# Patient Record
Sex: Male | Born: 1935 | Race: White | Hispanic: No | Marital: Married | Smoking: Never smoker
Health system: Southern US, Community
[De-identification: ages and names within clinical notes are randomized; demographics above are authoritative.]

## PROBLEM LIST (undated history)

## (undated) DIAGNOSIS — C801 Malignant (primary) neoplasm, unspecified: Secondary | ICD-10-CM

## (undated) DIAGNOSIS — F32A Depression, unspecified: Secondary | ICD-10-CM

## (undated) DIAGNOSIS — F329 Major depressive disorder, single episode, unspecified: Secondary | ICD-10-CM

## (undated) DIAGNOSIS — R269 Unspecified abnormalities of gait and mobility: Secondary | ICD-10-CM

## (undated) DIAGNOSIS — A809 Acute poliomyelitis, unspecified: Secondary | ICD-10-CM

## (undated) DIAGNOSIS — C61 Malignant neoplasm of prostate: Secondary | ICD-10-CM

## (undated) DIAGNOSIS — I639 Cerebral infarction, unspecified: Secondary | ICD-10-CM

## (undated) DIAGNOSIS — I10 Essential (primary) hypertension: Secondary | ICD-10-CM

## (undated) HISTORY — PX: KNEE SURGERY: SHX244

## (undated) HISTORY — DX: Cerebral infarction, unspecified: I63.9

---

## 2007-12-29 ENCOUNTER — Inpatient Hospital Stay (HOSPITAL_COMMUNITY): Admission: EM | Admit: 2007-12-29 | Discharge: 2007-12-30 | Payer: Self-pay | Admitting: Emergency Medicine

## 2007-12-29 ENCOUNTER — Ambulatory Visit: Payer: Self-pay | Admitting: Family Medicine

## 2009-05-15 ENCOUNTER — Encounter: Admission: RE | Admit: 2009-05-15 | Discharge: 2009-05-15 | Payer: Self-pay | Admitting: Geriatric Medicine

## 2009-07-05 ENCOUNTER — Emergency Department (HOSPITAL_COMMUNITY): Admission: EM | Admit: 2009-07-05 | Discharge: 2009-07-05 | Payer: Self-pay | Admitting: Emergency Medicine
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
## 2010-09-16 ENCOUNTER — Emergency Department (HOSPITAL_COMMUNITY): Payer: Medicare Other

## 2010-09-16 ENCOUNTER — Observation Stay (HOSPITAL_COMMUNITY)
Admission: EM | Admit: 2010-09-16 | Discharge: 2010-09-19 | Disposition: A | Discharge status: Home / Self Care | Payer: Medicare Other | Attending: Internal Medicine | Admitting: Internal Medicine

## 2010-09-16 ENCOUNTER — Encounter (HOSPITAL_COMMUNITY): Payer: Self-pay

## 2010-09-16 DIAGNOSIS — M4802 Spinal stenosis, cervical region: Secondary | ICD-10-CM | POA: Insufficient documentation

## 2010-09-16 DIAGNOSIS — I080 Rheumatic disorders of both mitral and aortic valves: Secondary | ICD-10-CM | POA: Insufficient documentation

## 2010-09-16 DIAGNOSIS — Z96659 Presence of unspecified artificial knee joint: Secondary | ICD-10-CM | POA: Insufficient documentation

## 2010-09-16 DIAGNOSIS — R9431 Abnormal electrocardiogram [ECG] [EKG]: Secondary | ICD-10-CM | POA: Insufficient documentation

## 2010-09-16 DIAGNOSIS — Z79899 Other long term (current) drug therapy: Secondary | ICD-10-CM | POA: Insufficient documentation

## 2010-09-16 DIAGNOSIS — G319 Degenerative disease of nervous system, unspecified: Secondary | ICD-10-CM | POA: Insufficient documentation

## 2010-09-16 DIAGNOSIS — I10 Essential (primary) hypertension: Secondary | ICD-10-CM | POA: Insufficient documentation

## 2010-09-16 DIAGNOSIS — Z8546 Personal history of malignant neoplasm of prostate: Secondary | ICD-10-CM | POA: Insufficient documentation

## 2010-09-16 DIAGNOSIS — F29 Unspecified psychosis not due to a substance or known physiological condition: Secondary | ICD-10-CM | POA: Insufficient documentation

## 2010-09-16 DIAGNOSIS — R29898 Other symptoms and signs involving the musculoskeletal system: Secondary | ICD-10-CM | POA: Insufficient documentation

## 2010-09-16 DIAGNOSIS — I7389 Other specified peripheral vascular diseases: Secondary | ICD-10-CM | POA: Insufficient documentation

## 2010-09-16 DIAGNOSIS — R0602 Shortness of breath: Secondary | ICD-10-CM | POA: Insufficient documentation

## 2010-09-16 DIAGNOSIS — G459 Transient cerebral ischemic attack, unspecified: Principal | ICD-10-CM | POA: Insufficient documentation

## 2010-09-16 DIAGNOSIS — I771 Stricture of artery: Secondary | ICD-10-CM | POA: Insufficient documentation

## 2010-09-16 DIAGNOSIS — R279 Unspecified lack of coordination: Secondary | ICD-10-CM | POA: Insufficient documentation

## 2010-09-16 DIAGNOSIS — Z8612 Personal history of poliomyelitis: Secondary | ICD-10-CM | POA: Insufficient documentation

## 2010-09-16 DIAGNOSIS — R269 Unspecified abnormalities of gait and mobility: Secondary | ICD-10-CM | POA: Insufficient documentation

## 2010-09-16 HISTORY — DX: Major depressive disorder, single episode, unspecified: F32.9

## 2010-09-16 HISTORY — DX: Depression, unspecified: F32.A

## 2010-09-16 HISTORY — DX: Acute poliomyelitis, unspecified: A80.9

## 2010-09-16 HISTORY — DX: Essential (primary) hypertension: I10

## 2010-09-16 LAB — CK TOTAL AND CKMB (NOT AT ARMC)
Relative Index: INVALID (ref 0.0–2.5)
Total CK: 93 U/L (ref 7–232)

## 2010-09-16 LAB — BASIC METABOLIC PANEL
Calcium: 9.3 mg/dL (ref 8.4–10.5)
Chloride: 105 mEq/L (ref 96–112)
Creatinine, Ser: 1.18 mg/dL (ref 0.50–1.35)
GFR calc Af Amer: >60 mL/min (ref >60)
Sodium: 137 mEq/L (ref 135–145)

## 2010-09-16 LAB — CBC
Hemoglobin: 15.7 g/dL (ref 13.0–17.0)
MCHC: 34 g/dL (ref 30.0–36.0)
MCV: 94.9 fL (ref 78.0–100.0)
WBC: 7.8 10*3/uL (ref 4.0–10.5)

## 2010-09-16 LAB — DIFFERENTIAL
Basophils Absolute: 0 10*3/uL (ref 0.0–0.1)
Eosinophils Relative: 4 % (ref 0–5)
Lymphocytes Relative: 20 % (ref 12–46)
Monocytes Absolute: 0.8 10*3/uL (ref 0.1–1.0)
Monocytes Relative: 10 % (ref 3–12)

## 2010-09-16 LAB — URINALYSIS, ROUTINE W REFLEX MICROSCOPIC
Glucose, UA: NEGATIVE mg/dL
Ketones, ur: NEGATIVE mg/dL
Leukocytes, UA: NEGATIVE
Protein, ur: NEGATIVE mg/dL
Specific Gravity, Urine: 1.024 (ref 1.005–1.030)

## 2010-09-17 ENCOUNTER — Inpatient Hospital Stay (HOSPITAL_COMMUNITY): Payer: Medicare Other

## 2010-09-17 DIAGNOSIS — G459 Transient cerebral ischemic attack, unspecified: Secondary | ICD-10-CM

## 2010-09-17 LAB — DIFFERENTIAL
Lymphocytes Relative: 24 % (ref 12–46)
Monocytes Absolute: 0.8 10*3/uL (ref 0.1–1.0)
Monocytes Relative: 10 % (ref 3–12)
Neutro Abs: 4.5 10*3/uL (ref 1.7–7.7)

## 2010-09-17 LAB — CBC
HCT: 44.7 % (ref 39.0–52.0)
WBC: 7.6 10*3/uL (ref 4.0–10.5)

## 2010-09-17 LAB — URINALYSIS, ROUTINE W REFLEX MICROSCOPIC
Bilirubin Urine: NEGATIVE
Ketones, ur: NEGATIVE mg/dL
Leukocytes, UA: NEGATIVE
Nitrite: NEGATIVE
Protein, ur: NEGATIVE mg/dL
Specific Gravity, Urine: 1.015 (ref 1.005–1.030)

## 2010-09-17 LAB — COMPREHENSIVE METABOLIC PANEL
ALT: 8 U/L (ref 0–53)
ALT: 7 U/L (ref 0–53)
AST: 22 U/L (ref 0–37)
AST: 22 U/L (ref 0–37)
Albumin: 3.5 g/dL (ref 3.5–5.2)
Albumin: 3.2 g/dL — ABNORMAL LOW (ref 3.5–5.2)
Alkaline Phosphatase: 51 U/L (ref 39–117)
CO2: 25 mEq/L (ref 19–32)
Calcium: 8.5 mg/dL (ref 8.4–10.5)
Chloride: 105 mEq/L (ref 96–112)
Creatinine, Ser: 0.99 mg/dL (ref 0.50–1.35)
GFR calc non Af Amer: >60 mL/min (ref >60)
Glucose, Bld: 89 mg/dL (ref 70–99)
Potassium: 3.8 mEq/L (ref 3.5–5.1)
Sodium: 139 mEq/L (ref 135–145)
Total Bilirubin: 0.8 mg/dL (ref 0.3–1.2)
Total Protein: 7.1 g/dL (ref 6.0–8.3)
Total Protein: 6.5 g/dL (ref 6.0–8.3)

## 2010-09-17 LAB — LIPID PANEL: Total CHOL/HDL Ratio: 2.9 RATIO

## 2010-09-17 LAB — PROTIME-INR
INR: 1.02 (ref 0.00–1.49)
Prothrombin Time: 13.6 seconds (ref 11.6–15.2)

## 2010-09-18 LAB — DIFFERENTIAL
Basophils Absolute: 0 10*3/uL (ref 0.0–0.1)
Basophils Relative: 0 % (ref 0–1)
Lymphocytes Relative: 16 % (ref 12–46)
Monocytes Absolute: 0.7 10*3/uL (ref 0.1–1.0)
Monocytes Relative: 8 % (ref 3–12)
Neutro Abs: 6.3 10*3/uL (ref 1.7–7.7)
Neutrophils Relative %: 74 % (ref 43–77)

## 2010-09-18 LAB — URINALYSIS, ROUTINE W REFLEX MICROSCOPIC
Leukocytes, UA: NEGATIVE
Nitrite: NEGATIVE
Protein, ur: NEGATIVE mg/dL
Specific Gravity, Urine: 1.022 (ref 1.005–1.030)
Urobilinogen, UA: 1 mg/dL (ref 0.0–1.0)

## 2010-09-18 LAB — URINE MICROSCOPIC-ADD ON

## 2010-09-18 LAB — BASIC METABOLIC PANEL
BUN: 25 mg/dL — ABNORMAL HIGH (ref 6–23)
CO2: 28 mEq/L (ref 19–32)
GFR calc non Af Amer: >60 mL/min (ref >60)
Glucose, Bld: 99 mg/dL (ref 70–99)
Potassium: 4.3 mEq/L (ref 3.5–5.1)
Sodium: 138 mEq/L (ref 135–145)

## 2010-09-18 LAB — CBC
HCT: 49.7 % (ref 39.0–52.0)
Hemoglobin: 17.1 g/dL — ABNORMAL HIGH (ref 13.0–17.0)
MCHC: 34.4 g/dL (ref 30.0–36.0)
RBC: 5.27 MIL/uL (ref 4.22–5.81)

## 2010-09-18 LAB — CARDIAC PANEL(CRET KIN+CKTOT+MB+TROPI)
CK, MB: 3.3 ng/mL (ref 0.3–4.0)
CK, MB: 3.5 ng/mL (ref 0.3–4.0)
Relative Index: INVALID (ref 0.0–2.5)
Troponin I: <0.3 ng/mL (ref <0.30)

## 2010-09-19 LAB — CBC
Hemoglobin: 15.8 g/dL (ref 13.0–17.0)
MCH: 31.9 pg (ref 26.0–34.0)
MCV: 93.5 fL (ref 78.0–100.0)
Platelets: 177 10*3/uL (ref 150–400)
RBC: 4.95 MIL/uL (ref 4.22–5.81)
WBC: 7.2 10*3/uL (ref 4.0–10.5)

## 2010-09-19 LAB — URINE CULTURE
Colony Count: NO GROWTH
Special Requests: NEGATIVE

## 2010-09-19 LAB — BASIC METABOLIC PANEL
CO2: 24 mEq/L (ref 19–32)
Chloride: 106 mEq/L (ref 96–112)
Creatinine, Ser: 0.77 mg/dL (ref 0.50–1.35)
Glucose, Bld: 90 mg/dL (ref 70–99)

## 2010-09-19 LAB — CARDIAC PANEL(CRET KIN+CKTOT+MB+TROPI)
CK, MB: 3.4 ng/mL (ref 0.3–4.0)
Relative Index: INVALID (ref 0.0–2.5)
Total CK: 79 U/L (ref 7–232)

## 2010-09-25 ENCOUNTER — Encounter (HOSPITAL_COMMUNITY): Payer: Self-pay | Admitting: Radiology

## 2010-09-25 ENCOUNTER — Emergency Department (HOSPITAL_COMMUNITY): Payer: Medicare Other

## 2010-09-25 ENCOUNTER — Observation Stay (HOSPITAL_COMMUNITY)
Admission: EM | Admit: 2010-09-25 | Discharge: 2010-09-27 | Disposition: A | Discharge status: Home Health | Payer: Medicare Other | Attending: Internal Medicine | Admitting: Internal Medicine

## 2010-09-25 DIAGNOSIS — K219 Gastro-esophageal reflux disease without esophagitis: Secondary | ICD-10-CM | POA: Insufficient documentation

## 2010-09-25 DIAGNOSIS — R209 Unspecified disturbances of skin sensation: Secondary | ICD-10-CM | POA: Insufficient documentation

## 2010-09-25 DIAGNOSIS — R0602 Shortness of breath: Secondary | ICD-10-CM | POA: Insufficient documentation

## 2010-09-25 DIAGNOSIS — R4182 Altered mental status, unspecified: Principal | ICD-10-CM | POA: Insufficient documentation

## 2010-09-25 DIAGNOSIS — Z8673 Personal history of transient ischemic attack (TIA), and cerebral infarction without residual deficits: Secondary | ICD-10-CM | POA: Insufficient documentation

## 2010-09-25 DIAGNOSIS — Z96659 Presence of unspecified artificial knee joint: Secondary | ICD-10-CM | POA: Insufficient documentation

## 2010-09-25 DIAGNOSIS — R112 Nausea with vomiting, unspecified: Secondary | ICD-10-CM | POA: Insufficient documentation

## 2010-09-25 DIAGNOSIS — Z96649 Presence of unspecified artificial hip joint: Secondary | ICD-10-CM | POA: Insufficient documentation

## 2010-09-25 DIAGNOSIS — R471 Dysarthria and anarthria: Secondary | ICD-10-CM | POA: Insufficient documentation

## 2010-09-25 DIAGNOSIS — I10 Essential (primary) hypertension: Secondary | ICD-10-CM | POA: Insufficient documentation

## 2010-09-25 DIAGNOSIS — R5381 Other malaise: Secondary | ICD-10-CM | POA: Insufficient documentation

## 2010-09-25 HISTORY — DX: Malignant neoplasm of prostate: C61

## 2010-09-25 HISTORY — DX: Malignant (primary) neoplasm, unspecified: C80.1

## 2010-09-25 LAB — APTT: aPTT: 27 seconds (ref 24–37)

## 2010-09-25 LAB — POCT I-STAT, CHEM 8
Calcium, Ion: 1.13 mmol/L (ref 1.12–1.32)
Chloride: 105 mEq/L (ref 96–112)
Creatinine, Ser: 1.2 mg/dL (ref 0.50–1.35)
Glucose, Bld: 102 mg/dL — ABNORMAL HIGH (ref 70–99)
Potassium: 4 mEq/L (ref 3.5–5.1)

## 2010-09-25 LAB — DIFFERENTIAL
Lymphs Abs: 1.1 10*3/uL (ref 0.7–4.0)
Monocytes Relative: 8 % (ref 3–12)
Neutro Abs: 5.4 10*3/uL (ref 1.7–7.7)
Neutrophils Relative %: 76 % (ref 43–77)

## 2010-09-25 LAB — CBC
HCT: 45.9 % (ref 39.0–52.0)
Hemoglobin: 15.2 g/dL (ref 13.0–17.0)
MCH: 31 pg (ref 26.0–34.0)
MCV: 93.5 fL (ref 78.0–100.0)
RBC: 4.91 MIL/uL (ref 4.22–5.81)

## 2010-09-25 LAB — PROTIME-INR
INR: 1.09 (ref 0.00–1.49)
Prothrombin Time: 14.3 seconds (ref 11.6–15.2)

## 2010-09-26 LAB — COMPREHENSIVE METABOLIC PANEL
ALT: 7 U/L (ref 0–53)
AST: 20 U/L (ref 0–37)
CO2: 25 mEq/L (ref 19–32)
Calcium: 9.3 mg/dL (ref 8.4–10.5)
GFR calc non Af Amer: >60 mL/min (ref >60)
Sodium: 139 mEq/L (ref 135–145)

## 2010-09-26 LAB — CARDIAC PANEL(CRET KIN+CKTOT+MB+TROPI)
Relative Index: INVALID (ref 0.0–2.5)
Troponin I: <0.3 ng/mL (ref <0.30)

## 2010-09-26 LAB — CBC
MCH: 33.5 pg (ref 26.0–34.0)
Platelets: 212 10*3/uL (ref 150–400)
RBC: 5.01 MIL/uL (ref 4.22–5.81)
WBC: 9.9 10*3/uL (ref 4.0–10.5)

## 2010-09-26 LAB — D-DIMER, QUANTITATIVE: D-Dimer, Quant: 0.29 ug/mL-FEU (ref 0.00–0.48)

## 2010-09-27 LAB — CORTISOL: Cortisol, Plasma: 14.5 ug/dL

## 2010-09-30 NOTE — Discharge Summary (Signed)
NAMEWILLI, Ricardo [name] NO.:  [id number]  MEDICAL RECORD NO.:  [id number]  LOCATION:                                 FACILITY:  PHYSICIAN:  Altha Harm, MDDATE OF BIRTH:  1921-03-20  DATE OF ADMISSION:  09/25/2010 DATE OF DISCHARGE:  09/27/2010                              DISCHARGE SUMMARY   DISCHARGE DISPOSITION:  Home with home health PT, OT, and RN services.  FINAL DISCHARGE DIAGNOSES: 1. Transient intermittent altered mental status. 2. Suspect focal partial seizures. 3. Consider transient ischemic attack. 4. Hypertension. 5. Deconditioning.  DISCHARGE MEDICATIONS: 1. Amlodipine 5 mg p.o. daily. 2. Plavix 75 mg p.o. daily. 3. Lisinopril 10 mg p.o. daily. 4. Melatonin 5 mg p.o. nightly. 5. Tramadol 50 mg p.o. q.6 h. as needed for pain. 6. Vitamin D2 2000 units p.o. daily.  CONSULTANTS:  None.  PROCEDURES:  None.  DIAGNOSTIC STUDIES:  CT of the head without contrast shows no acute intracranial abnormality, diffuse low density throughout the periventricular and subcortical white matter.  It suggested for chronic small vessel ischemic changes.  REVIEW OF OLD RECORDS:  The patient was recently hospitalized on September 17, 2010, had a full workup with the MR of the brain and MRA both of which was were negative.  He had a 2-D echocardiogram performed, which shows ejection fraction of 60-65%.  No regional wall motion abnormalities were noted.  The patient also had a carotid duplex done at that time, which shows no significant extracranial carotid artery stenosis.  He also had a MRA of the brain done at that time and the findings were mild intracranial atherosclerotic-type changes.  PRIMARY CARE PHYSICIAN:  Hal T. Stoneking, MD  ALLERGIES: 1. NAPROXEN. 2. PENICILLIN. 3. SULFA.  CODE STATUS:  Full code.  CHIEF COMPLAINTS:  Presyncope.  For details of the HPI, please refer to the H and P dictated by Dr. Mikeal Hawthorne, however in short, in speaking  with the patient and his daughter, apparently the patient has been having intermittent almost daily episodes at around noon where he has some apparent confusion.  He states that he feels washed out and his daughter says that he sometimes has some right-sided weakness, which resolves in about an hour and then the patient is back to normal.  The patient is not sure as to whether or not he has any loss of consciousness at this time, but he does have some ulcerations in his mental status.  HOSPITAL COURSE: 1. Altered mental status/questionable presyncope.  In a more thorough     review of the history, it appears that the patient has more of an     altered mental status rather than presyncopal or syncopal episodes.     The patient has an extensive workup just about a week ago for TIAs     and he was found to have no significant reversible risk factors.  I     think that we cannot rule out the possibility of partial focal     seizures in this gentleman and an EEG would be in order.  The     patient, however, would have to be hospitalized until Monday to  receive the EEG.  The patient is now back to his baseline of     functioning and the patient and his daughter have made a decision     that they would like to be discharged.  However, I did ask them to     speak with Dr. Merlene Laughter, the primary care physician and ask     him to make an outpatient neurology referral and specifically also     for an EEG evaluation for possible seizures.  I will defer to Dr.     Pete Glatter to follow through with this.  Also of note, the patient     with the initial presentation in the emergency room has spoken with     Neurology about POINT trial which was tested placebo against     Plavix.  The patient initially agreed and consented to enroll in     the trial, however, subsequently dis-enrolled from the trial and     back on his Plavix.  The patient also had orthostatic checked while     hospitalized and  he was found not to be orthostatic during this     hospitalization. 2. Hypertension.  This was well managed during this hospitalization on     lisinopril and amlodipine. 3. Mobility and conditioning.  The patient was seen by Physical     Therapy and their recommendation at this time was that the patient     should receive home physical therapy as well as occupational     therapy.  Given the patient's overall medical status, I have also     recommended an RN evaluation within the home to see whether or not     there is any necessity for disease management.  Please note that     the patient had recent travels to [facility] and in consideration of     this a D-dimer was done which was found to be normal.     Additionally, cortisol levels were checked and those were found to     be also normal on this patient.  GENERAL:  At the time of discharge, the patient is stable.  He is actually feeling back to his baseline of functioning and mentation.  He is well appearing. VITAL SIGNS:  Temperature is 97.7, heart rate 69, blood pressure 147/73, respiratory rate 19, and O2 sats are 97% on room air. HEENT:  He is normocephalic and atraumatic.  Pupils are equally round and reactive to light and accommodation.  Extraocular movements are intact.  Oropharynx is moist.  No exudate, erythema, or lesions are noted. NECK:  Trachea is midline.  No masses, no thyromegaly, no JVD, no carotid bruit. RESPIRATORY:  The patient has a normal respiratory effort and equal excursion bilaterally.  No wheezing or rhonchi noted. CARDIOVASCULAR:  He has got normal S1 and S2.  No murmurs, rubs, or gallops are noted.  PMI is nondisplaced.  No heaves or thrills on palpation. ABDOMEN:  Obese, soft, nontender, and nondistended.  No masses.  No hepatosplenomegaly is noted. EXTREMITIES:  No clubbing, cyanosis, or edema. NEUROLOGICAL:  The patient has no focal neurological deficits.  Cranial nerves II-XII are grossly  intact. PSYCHIATRIC:  He is alert and oriented x3.  He is somewhat hard-of- hearing, but he has good insight and cognition, good recent and remote recall.  DIETARY RESTRICTIONS:  The patient should be on a heart-healthy diet.  PHYSICAL RESTRICTIONS:  The patient should ambulate with the use of a walker and  under the direction of Physical Therapy.  FOLLOWUP:  He is to follow up with his primary care physician, Dr. Merlene Laughter in 2-3 days.  At that time, I would recommend referral for outpatient neurology evaluation and particularly an EEG evaluation to rule out seizures.  All of the above have been discussed with the patient and his daughter, Holles.  Total time for this discharge process including face-to-face time is approximately 40 minutes.     Altha Harm, MD     MAM/MEDQ  D:  09/27/2010  T:  09/27/2010  Job:  [id number]  Electronically Signed by Marthann Schiller MD on 09/30/2010 08:24:48 PM

## 2010-10-09 NOTE — Discharge Summary (Addendum)
NAMEALFREDO, SPONG NO.:  [id number]  MEDICAL RECORD NO.:  [id number]  LOCATION:  1415                         FACILITY:  [facility]  PHYSICIAN:  Ramiro Harvest, MD    DATE OF BIRTH:  06-17-1921  DATE OF ADMISSION:  09/16/2010 DATE OF DISCHARGE:  09/18/2010                        DISCHARGE SUMMARY - REFERRING   PRIMARY CARE PROVIDER:  Hal T. Stoneking, M.D.  DISCHARGE DIAGNOSIS: 1. Right-sided weakness/transient ischemic attack. 2. Hypertension. 3. History of right knee replacement. 4. History of prostate surgery.  DISCHARGE MEDICATIONS: 1. Plavix 75 mg p.o. daily. 2. Advil PM 1 tablet p.o. daily at bedtime as needed for sleep. 3. Amlodipine 5 mg p.o. daily. 4. Lisinopril 10 mg p.o. daily. 5. Tramadol 50 mg p.o. every 6 hours as needed for pain.  DIAGNOSTIC LABS:  WBC 7.8, hemoglobin 15.7, hematocrit 46.2, platelets 184.  Sodium 137, potassium 3.9, chloride 105, CO2 of 24, BUN 28, creatinine 1.18, glucose 92.  First set of cardiac enzymes, total CK 93, CK-MB 4.0, troponin I less than 0.30.  Urinalysis was negative.  PT 13.6, INR 1.02, PTT 30, magnesium 1.9.  Lipid profile shows a cholesterol of 172, triglycerides 52, HDL 59, LDL 103, hemoglobin A1c 5.6.  DIAGNOSTIC IMAGING: 1. Chest x-ray done on July 31.  No evidence of active pulmonary     disease.  Stable appearance of the chest since previous study. 2. CT of the head, no acute intracranial abnormality.  Stable cerebral     atrophy and chronic small vessel disease. 3. MRI of the head on August 1 shows no acute infarct.  Moderate small     vessel disease type changes.  Global atrophy without hydrocephalus.     Cervical spondylitic changes with spinal stenosis and mild cord     flattening of C3-C4. 4. MRA of the head done on August 1 shows mild intracranial     atherosclerotic type changes.  No aneurysm noted. 5. 2-D echo done on August 1 yields an ejection fraction of 60%-65%.     Left ventricular  diastolic function, parameters normal.  Aortic     valve, moderate regurgitation.  Mitral valve, calcified annulus.     Mildly thickened leaflets.  No cardiac source of emboli was     identified. 6. Carotid Dopplers done on August 1 yields no significant     extracranial carotid artery stenosis demonstrated.  Vertebrals are     patent with antegrade flow.  CONSULTATIONS:  None.  PROCEDURES:  None.  BRIEF HISTORY:  Mr. Winebarger is a very pleasant 77 year old male with a history of hypertension, who recently traveled to [place] and was back on July 30 and was at his assisted living on July 31 walking down the hall when he suddenly felt weak on his right side.  He reports that both the upper and lower extremity became weak at the same time and he had difficulty walking.  He usually walks with a walker.  He denied loss of consciousness.  He did not have any fall.  There was no report of difficulty speaking or difficulty swallowing.  At the dinner table, he was not able to grip his fork and was brought to the  emergency room for evaluation.  He indicated that the event lasted between an hour and hour and a half, and by the time he was seen in the emergency room, his symptoms had largely improved and he was back to his baseline.  In the ED, he had a CT of the head which was negative.  He was admitted for further observation and rule out any TIA or possible CVA.  HOSPITAL COURSE BY PROBLEM: 1. Right-sided weakness/TIA.  The patient was admitted to telemetry     unit.  He remained in sinus rhythm during his hospitalization.  He     had a 2-D echo, carotid Dopplers, MRI, MRA with results as noted as     above.  He was started on Plavix as he has an intolerance to     aspirin.  His symptoms were resolved and there was no recurrence of     his symptoms during this hospitalization.  Dr. Janee Morn spoke with     Neurology regarding the plan of discharge on Plavix and follow up     with his primary  care provider in 7-10 days and they were in     agreement.  He was also seen by PT and OT and it was recommended     that he have some home health PT and that is being arranged.  He     lives in assisted living. 2. Hypertension.  On admission, his lisinopril and amlodipine were     started.  On August 1, his amlodipine was started back and his     blood pressure had a fair to poor control.  At the time of     discharge, he was started back on his lisinopril and his     amlodipine.  He will follow up with his primary care provider, Dr.     Merlene Laughter in 7-10 days. 3. History of right knee replacement.  The patient does ambulate with     a walker.  He was evaluated by PT and OT and during his     hospitalization, he remained at baseline with regard to his gait. 4. History of prostate surgery.  This was stable and baseline during     his hospitalization.  No difficulties voiding.  PHYSICAL EXAMINATION:  VITAL SIGNS:  Temperature 97.3, blood pressure 157/73, heart rate 76, respiratory rate 16, sats 96% on room air. GENERAL:  Awake, alert, sitting in chair, eating breakfast.  No acute distress. CV:  Regular rate and rhythm.  No murmur, gallop, or rub.  No lower extremity edema. RESPIRATORY:  Normal effort.  Breath sounds clear to auscultation bilaterally.  No rhonchi, wheezes, or rales. ABDOMEN:  Flat, soft, positive bowel sounds throughout, nontender to palpation. NEURO:  Speech clear.  Facial symmetry.  Cranial nerves II through XII grossly intact.  ACTIVITY:  As tolerated.  DIET:  Heart healthy.  FOLLOWUP:  Patient has an appointment with Dr. Merlene Laughter on August 9, at 3:15 in the afternoon.  DISPOSITION:  The patient is medically stable and ready for discharge.  TIME SPENT:  40 minutes.     Gwenyth Bender, NP   ______________________________ Ramiro Harvest, MD    KMB/MEDQ  D:  09/18/2010  T:  09/18/2010  Job:  [id number]  cc:   Hal T. Stoneking, M.D. Fax:  [phone]  Electronically Signed by Toya Smothers  on 10/09/2010 07:50:00 AM Electronically Signed by Ramiro Harvest MD on 10/23/2010 12:01:58 PM

## 2010-10-09 NOTE — Discharge Summary (Addendum)
  Ricardo [name], Ricardo [name] NO.:  [id number]  MEDICAL RECORD NO.:  [id number]  LOCATION:  1415                         FACILITY:  [facility]  PHYSICIAN:  Ramiro Harvest, MD    DATE OF BIRTH:  [birth year]  DATE OF ADMISSION:  09/16/2010 DATE OF DISCHARGE:  09/19/2010                        DISCHARGE SUMMARY - REFERRING   ADDENDUM  PRIMARY CARE PROVIDER:  Hal T. Stoneking, MD  HISTORY:  The patient was prepared for discharge yesterday afternoon when he suddenly complained of sudden onset of weakness and requested to go back to bed.  He became slightly pale, denied chest pain, shortness of breath, diaphoresis.  His vital signs were 150/79, heart rate 82, respirations 18, telemetry remained in sinus rhythm.  There were no other acute changes noted.  He did complain of a headache, for which he received Tylenol and was resolved by later in the evening.  Because of the sudden onset of weakness, it was decided that it would be prudent to keep him here.  Cardiac enzymes were cycled and negative x3.  His TSH is within normal limits.  Urine was negative for UTI.  He is afebrile and his white count is within normal limits.  He states this morning that he slept well through the night and feels much better.  He is ambulated in the hall with physical therapy without incident.  PHYSICAL EXAMINATION:  VITAL SIGNS:  Temperature 97.6, blood pressure 169/73, heart rate 74, respiration 16, sats 94% on room air. GENERAL:  Awake, alert, sitting up in chair, smiling, no acute distress. CV:  Regular rate and rhythm.  No murmur, gallop or rub. EXTREMITIES:  No lower extremity edema. RESPIRATORY:  Normal effort.  Breath sounds, clear to auscultation bilaterally.  No rhonchi, wheezes, or rales. ABDOMEN:  Flat, soft, positive bowel sounds throughout, nontender to palpation. NEUROLOGIC:  Alert and oriented x3.  Speech clear.  Facial symmetry. Cranial nerves II through XII grossly  intact.  FOLLOWUP:  As dictated on September 18, 2010.  DISPOSITION:  The patient is medically stable and ready to be discharged home.  TIME SPENT:  15 minutes.     Gwenyth Bender, NP   ______________________________ Ramiro Harvest, MD    KMB/MEDQ  D:  09/19/2010  T:  09/19/2010  Job:  [id number]  cc:   Hal T. Stoneking, M.D. Fax: [phone]  Electronically Signed by Toya Smothers  on 10/09/2010 07:50:08 AM Electronically Signed by Ramiro Harvest MD on 10/23/2010 12:02:06 PM

## 2010-10-21 NOTE — H&P (Signed)
Ricardo, [name] NO.:  [id number]  MEDICAL RECORD NO.:  [id number]  LOCATION:  1415                         FACILITY:  [facility]  PHYSICIAN:  Ricardo [name], MDDATE OF BIRTH:  1921/06/07  DATE OF ADMISSION:  09/16/2010 DATE OF DISCHARGE:                             HISTORY & PHYSICAL   PRIMARY CARE PHYSICIAN:  Ricardo T. Stoneking, MD  CHIEF COMPLAINT:  Right-sided weakness with difficulty walking.  HISTORY OF PRESENTING ILLNESS:  An 77 year old male with history of hypertension, who has recently traveled to [place] and was back yesterday, who was in his assisted-living facility today with his daughter when he was walking towards the hall for his suppertime at 5 p.m. when he suddenly felt weak in his right side, both upper and lower extremity, also had difficulty walking.  He did not lose consciousness. He did not have any fall.  He did not have any difficulty speaking or saying.  He did not have any difficulty swallowing.  At the dinner table, he was not able to grip the fork well and was brought to the ER. The whole incident lasted around 1-1/2 hours.  At that time, Ms. Ricardo [name], the ER PA saw the patient and his symptoms have largely improved and as per the patient's family, he is back to his baseline.  The patient at this time had a CT head, which was negative.  He was admitted for further observation to rule out any TIA or possible CVA.  The patient denies any chest pain, shortness of breath, nausea, vomiting, abdominal pain, dysuria, discharge, diarrhea.  Denies any weakness in the left upper or lower extremity.  Denies any headache or any cough or phlegm or fever or chills.  PAST MEDICAL HISTORY:  History of hypertension.  PAST SURGICAL HISTORY:  He has had back surgery and right knee replacement.  He also had prostate surgery.  MEDICATIONS ON ADMISSION:  The patient states he takes 2 medications, one of them is for blood pressure, he does not  recall the name.  SOCIAL HISTORY:  The patient lives at [facility].  Denies smoking cigarettes, drinking alcohol, or using illegal drugs.  He is a full code.  FAMILY HISTORY:  Positive for stroke and MI.  His brother died at age 54 from MI.  REVIEW OF SYSTEMS:  As per the history of presenting illness, nothing else significant.  ALLERGIES:  The patient is allergic to: 1. NAPROSYN caused respiratory distress requiring ICU stay. 2. SULFA. 3. PENICILLIN.  PHYSICAL EXAMINATION:  GENERAL:  The patient examined at bedside, not in acute distress. VITAL SIGNS:  Blood pressure is 135/78, pulse is 80 per minute, temperature 97.3, respiration is 18 per minute, O2 sat 95%. HEENT:  Anicteric.  No pallor.  PERRLA positive.  No facial asymmetry. Tongue is midline. NECK:  No neck rigidity. CHEST:  Bilateral air entry present.  No rhonchi.  No crepitation. HEART:  S1, S2 heard. Abdomen:  Soft, nontender.  Bowel sounds heard. CNS:  The patient is alert, awake, and oriented to time, place, and person.  He is hard-of-hearing.  He is able to move upper and lower extremities 5/5.  He has a good grip strength.  He has no pronator drift.  He has some mild tremors.  No dysdiadochokinesia.  No ataxia. EXTREMITIES:  Peripheral pulses felt.  No edema.  LABORATORY DATA:  EKG shows normal sinus rhythm with heart rate is around 81 beats per minute.  Chest x-ray shows no evidence of active pulmonary disease, stable appearance of the chest from his previous study.  CT head without contrast media shows no acute intracranial abnormalities, stable cerebral atrophy, and chronic small vessel disease.  CBC, WBC is 7.8, hemoglobin is 15.7, hematocrit is 46.2, platelets 184.  Basic metabolic panel, sodium 137, potassium 3.9, chloride 105, carbon dioxide 24, glucose 92, BUN 28, creatinine 1.1, calcium 9.3, CK 93, MB 4, troponin I less than 0.3.  UA is negative for nitrites and leukocytes.  ASSESSMENT: 1. Possible  cerebrovascular accident versus transient ischemic attack 2. History of hypertension. 3. History of previous polio.  PLAN: 1. At this time, we will admit the patient to telemetry for his     possible CVA versus TIA. 2. The patient will be placed on neuro checks, swallow evaluation.     Get MRI of the brain, MRA of the brain, 2D echo, and carotid     Doppler. 3. I will place the patient on Plavix as the patient does have a     significant allergic reaction to Naprosyn and there was a concern     that aspirin may have a positive reaction with naproxen. 4. We need to verify his home medication and further recommendation as     his condition evolves.     Ricardo Clos, MD     ANK/MEDQ  D:  09/17/2010  T:  09/17/2010  Job:  [id number]  cc:   Ricardo [name], M.D. Fax: [phone]  Electronically Signed by Ricardo Minium MD on 10/21/2010 08:57:02 AM

## 2010-11-05 NOTE — Consult Note (Signed)
NAMEMarland Kitchen  Ricardo [name], Ricardo [name] NO.:  [id number]  MEDICAL RECORD NO.:  [id number]  LOCATION:  [facility]                         FACILITY:  [facility]  PHYSICIAN:  Melvyn Novas, M.D.  DATE OF BIRTH:  [birth year]  DATE OF CONSULTATION: DATE OF DISCHARGE:  09/25/2010                                CONSULTATION   HISTORY:  This is an 77 year old Caucasian right-handed gentleman whom I encountered at the [facility] ED room 12.  This patient was just discharged on September 19, 2010, after an extensive TIA workup at [facility] was concluded.  His primary care physician is Dr. Merlene Laughter. His in-hospital physician was Dr. Ramiro Harvest, MD.  The patient had been prepared for discharge already on September 18, 2010, when he suddenly complained of onset of right-sided weakness and became also pale.  He had stable vital signs at that time.   The patient's discharge from [facility] was thereby postponed for 1 day.    He had an extensive workup that showed no indication of a higher risk of stroke including a CT of the head, no intracranial abnormalities were noted.   An MRI of the brain was obtained on September 17, 2010, and showed no acute infarct.  There was global atrophy without hydrocephalus and some cervical spondylosis. Small vessel type changes were seen as expected for the age group.  All larger vessels appear patent.  No aneurysm was noted.    Today, he presented to his primary care physician, Dr. Merlene Laughter in an outpatient setting at about 3 o'clock and by 3:30 arrived here at [facility] with the emergency squad.  He had another of his spells right in front of his primary care physician described again as right-sided weakness and stuttering speech, aphasia.  The patient had been discharged on Plavix and the question now is if this patient should have another antiplatelet agent or if he is able to tolerate another drug. The emergency room physician that called me inquired if  the patient could be placed on dipyridamole without an antiplatelet agent quoting the patient had an aspirin allergy and was therefore switched to Plavix. The patient is only allergic to naprosyn so far and penicillin.  He has a history of previous polio, a history of hypertension and possible cerebrovascular TIAs.  No stroke.   A concern had been the possible cross allergy from naprosyn to aspirin and the patient was therefore placed on Plavix and was sent after reading the history and physical from September 17, 2010.   When I encountered him in the ER, he appears in no acute distress and he told me that he has never had an aspirin allergy and that he has taken aspirin on several occasions without problems, so I made changes to his past medical history information.  PAST MEDICAL HISTORY:  Right-sided weakness spells that are attributed to transient ischemic attacks and associated with aphasia, hypertension, a history of the right knee total knee replacement and a history of prostate surgery.  MEDICATIONS:  The patient is on Plavix 75 mg daily,  on Advil p.m.( right here is an NSAID), on amlodipine 5 mg  daily and on tramadol 50  mg p.r.n. it do not exceed q. 6 hours as needed forpain.  DIAGNOSTIC LABS:  The patient's PTT today 27, PT 14.3, INR 1.9, CK-MB 62 and 2.9.  CBC white blood cell count 7.1, hemoglobin 15.2, hematocrit 45.9 and platelet count 187, troponin 0.03.  PHYSICAL EXAMINATION:  VITAL SIGNS:  The patient's blood pressure varies between 140 and 157 mmHg for systolic blood pressure and is around 70 for the diastolic blood pressure, O2 saturation on room air is 95%, temperature is 97.6, pulse rate is 74 and is regular, respirations 16 and unlabored. RRR - HAS NO BRUITS.  LUNGS:  Clear to auscultation. ABDOMEN:  Soft, nontender. NEUROLOGIC:  MS The patient's mental status appears to be intact.  He has been able to give me a past medical history.  He gave me the  phone number for his daughter.  Could recall his physicians, knows who the president is and the date.  Knows in which city is he, current Olympics take place and explained to me that he is living with his wife of 57 years at [employer], that he is not longer driving, that he retired at age 29, and that his wife suffers from Alzheimer disease which he stated "I can handle very well."    The patient's speech is clear and unlabored, not stuttering, not slurred.  The patient follows multiple step commands without difficulty. There is no facial droop.   There is a symmetric smile.  Tongue and uvula are midline.  Elevation is prompt.  The patient has coarse saccadic eye movements and there may be a blind spot centrally but he has no peripheral vision loss that would be unilateral.  Pupils are equal and round.  The neck is supple.  The patient can extend both upper extremities against gravity without drift but has a resting tremor.  Finger-nose-finger testing shows delay and dysmetria on the right side.  When finger-nose-finger testing is performed with the open eyes, the dysmetria is much less evident.  The patient has bilaterally good grip strength.   He was able to extend all extremities antigravity and provides a normal muscle tone.  No cogwheeling.  Deep tendon reflexes are symmetric 1+ throughout.  He did have a foot deformity on the right side on very high arch and hammertoes and I could not elicit a Babinski on the right.   Also, he affirmed that he could feel vibration, fine touch, pinprick and temperature in all four extremities and on the trunk.  EKG sinus rhythm.  This patient just had a complete transient ischemic attack workup at [facility] during a recent stay.  He was discharged just 5 days ago.  Carotid Dopplers were obtained and showed normal antegrade flow.  The patient's echo showed no source of emboli.  Aorta was normal, not dilated.  Normal measurements  and an ejection fraction that was called normal.  Mildly thickened leaflets of the aortic valve and a calcified annulus at the mitral valve, EF was 60-65%.  ASSESSMENT: 1. Transient ischemic attack with complete resolution, NIH stroke     scale is zero currently. 2. 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  Interestingly, the transient ischemic attack also     affects the right side. 3. Hypertension.  The patient is on medication and the vitals now     appear controlled.  PLAN:  I would like for this patient to enrol in the point trial since we  have clarified that he does not have an aspirin allergy and he can take Naprosyn p.m. apparently, he should be able to be enrolled into the aspirin branch or unnamed new antiplatelet branch.  He agrees that he is interested in the study.  I have called Beryl Meager to evaluate the patient and to obtain informed consent and I will go on to call his daughter now.  The patient requests an outpatient followup with Dr. Pearlean Brownie at [facility].     Melvyn Novas, M.D.     CD/MEDQ  D:  09/25/2010  T:  09/26/2010  Job:  [id number]  cc:   Pramod P. Pearlean Brownie, MD  Electronically Signed by Melvyn Novas M.D. on 11/05/2010 11:53:44 AM

## 2010-11-18 LAB — COMPREHENSIVE METABOLIC PANEL
Alkaline Phosphatase: 53
BUN: 12
CO2: 28
Chloride: 105
Glucose, Bld: 136 — ABNORMAL HIGH
Potassium: 3.5
Total Bilirubin: 0.8

## 2010-11-18 LAB — CARDIAC PANEL(CRET KIN+CKTOT+MB+TROPI)
Total CK: 170
Troponin I: 0.01

## 2010-11-18 LAB — POCT CARDIAC MARKERS
CKMB, poc: 1.4
Troponin i, poc: <0.05

## 2010-11-29 NOTE — H&P (Signed)
Ricardo [name], Ricardo [name]               ACCOUNT NO.:  [id number]  MEDICAL RECORD NO.:  [id number]  LOCATION:                                 FACILITY:  PHYSICIAN:  Lonia Blood, M.D.      DATE OF BIRTH:  1921-03-26  DATE OF ADMISSION:  09/26/2010 DATE OF DISCHARGE:                             HISTORY & PHYSICAL   PRIMARY CARE PHYSICIAN:  Hal T. Stoneking, MD  PRESENTING COMPLAINT:  Almost passed out as well as nausea.  HISTORY OF PRESENT ILLNESS:  The patient is an 77 year old gentleman that was admitted on September 17, 2010, with TIA, had extensive workup and was placed on Plavix.  He went home and came back today again with TIA symptoms.  He was seen in the ED by Neurology.  After evaluation, he was placed on some protocol for recurrent TIA.  He was actually being discharged home when he had sudden onset of nausea, vomiting, and weakness.  He almost passed out and was brought back to the emergency room.  He was involving in the POINT trial apparently which involved Plavix.  The patient had a similar episode last week when he was in the hospital.  He was getting ready to be discharged when he suddenly developed weakness.  He was pale and had shortness of breath.  His vitals were stable as they are now.  It was associated with headache which he has also now.  After an overnight stay in the hospital on only Tylenol, the patient felt better prior to being discharged.  We are now admitting him mainly for observation.  PAST MEDICAL HISTORY:  Significant for: 1. Recurrent TIAs. 2. History of prostate cancer status post surgery. 3. History of aortic insufficiency. 4. Depression. 5. Diverticulosis. 6. Hiatal hernia. 7. Hypertension. 8. History of mitral valve regurgitation. 9. History of polio. 10.History of right bundle-branch block. 11.History of vitamin D deficiency.  ALLERGIES:  Multiple drugs including NAPROSYN, OXYCONTIN, PENICILLIN, QUINIDINE, STREPTOMYCIN, and  SULFA.  CURRENT MEDICATIONS: 1. Plavix 75 mg daily. 2. Advil PM as needed. 3. Amlodipine 5 mg daily. 4. Lisinopril 10 mg daily. 5. Tramadol 50 mg q.6 h. p.r.n. 6. Vitamin D 2000 units with meals daily. 7. Melatonin 5 mg every evening.  SOCIAL HISTORY:  The patient lives with his wife in [place].  No tobacco, alcohol, or IV drug use.  FAMILY HISTORY:  Noncontributory.  REVIEW OF SYSTEMS:  All systems reviewed are negative except per HPI.  PHYSICAL EXAMINATION:  VITAL SIGNS:  Temperature 98.1, blood pressure 129/59, pulse 84, respiratory rate 17, sats 97% on room air. GENERAL:  He is awake, alert, oriented, pleasant man who is in no acute distress. HEENT:  PERRL.  EOMI.  No pallor, no jaundice.  No rhinorrhea. NECK:  Supple.  No JVD, no lymphadenopathy. RESPIRATORY:  He has good air entry bilaterally.  No significant wheeze. No rales.  No crackles. CARDIOVASCULAR:  He has S1 and S2.  No audible murmurs. ABDOMEN:  Soft, full, nontender with positive bowel sounds. EXTREMITIES:  No edema, cyanosis, or clubbing. SKIN:  He has multiple freckles but no evidence of bleed.  No ulcers. No rashes.  LABORATORY DATA:  Here,  sodium 139, potassium 4.0, chloride 105, glucose 102, BUN 22, creatinine 1.20.  Troponin I is negative.  White count is 7.1, hemoglobin 15.2 with platelet of 187.  CK is 62 with MB of 2.9, PT 14.2, INR 1.09, PTT of 27.  Head CT without contrast showed no acute abnormalities. His EKG showed no significant change from his previous EKG just a week ago.  ASSESSMENT:  This is an 77 year old gentleman with sudden onset of presyncopal episode after being discharged from the emergency department.  He had multiple recurrent transient ischemic attacks. Apparently, he had full syncopal episode in November 2011, the cause was not entirely known.  He felt nauseated.  His current symptoms may be related to the study drug that he got which is the experimental clopidogrel.  It  is also possible that he is having some type of arrhythmias, that is recurrent.  Plan therefore: 1. Presyncope.  Admit the patient mainly for observation overnight.     Hydrate the patient, keep him on monitored bed.  The patient may     need to have a Holter monitor at home for a while since this seems     to be recurrent.  His so called recurrent TIAs may actually turn     out to be recurrent syncopal episodes from some cardiac events like     arrhythmias.  He had right bundle-branch block.  We will cycle     enzymes and consider cardiac consult for possible Holter. 2. Nausea, vomiting.  The patient is currently not nauseated, so we     will treat him symptomatically. 3. Recurrent TIAs.  Again, he has had full workup just a week ago and     nothing was found at that time. 4. Hypertension.  Continue with home medication as much as possible. 5. GERD.  I will start the patient on some PPIs for now.     Lonia Blood, M.D.     Verlin Grills  D:  09/26/2010  T:  09/26/2010  Job:  [id number]  Electronically Signed by Lonia Blood M.D. on 11/29/2010 02:51:07 PM

## 2011-03-31 DIAGNOSIS — M542 Cervicalgia: Secondary | ICD-10-CM | POA: Diagnosis not present

## 2011-05-01 DIAGNOSIS — Z79899 Other long term (current) drug therapy: Secondary | ICD-10-CM | POA: Diagnosis not present

## 2011-05-01 DIAGNOSIS — Z0000 Encounter for general adult medical examination without abnormal findings: Secondary | ICD-10-CM | POA: Diagnosis not present

## 2011-05-01 DIAGNOSIS — Z1331 Encounter for screening for depression: Secondary | ICD-10-CM | POA: Diagnosis not present

## 2011-05-01 DIAGNOSIS — G479 Sleep disorder, unspecified: Secondary | ICD-10-CM | POA: Diagnosis not present

## 2011-05-05 DIAGNOSIS — I059 Rheumatic mitral valve disease, unspecified: Secondary | ICD-10-CM | POA: Diagnosis not present

## 2011-05-05 DIAGNOSIS — I10 Essential (primary) hypertension: Secondary | ICD-10-CM | POA: Diagnosis not present

## 2011-05-05 DIAGNOSIS — I359 Nonrheumatic aortic valve disorder, unspecified: Secondary | ICD-10-CM | POA: Diagnosis not present

## 2011-05-12 DIAGNOSIS — I10 Essential (primary) hypertension: Secondary | ICD-10-CM | POA: Diagnosis not present

## 2011-05-12 DIAGNOSIS — M542 Cervicalgia: Secondary | ICD-10-CM | POA: Diagnosis not present

## 2011-05-12 DIAGNOSIS — I359 Nonrheumatic aortic valve disorder, unspecified: Secondary | ICD-10-CM | POA: Diagnosis not present

## 2011-05-12 DIAGNOSIS — Z0389 Encounter for observation for other suspected diseases and conditions ruled out: Secondary | ICD-10-CM | POA: Diagnosis not present

## 2011-05-12 DIAGNOSIS — I059 Rheumatic mitral valve disease, unspecified: Secondary | ICD-10-CM | POA: Diagnosis not present

## 2011-05-12 DIAGNOSIS — M25559 Pain in unspecified hip: Secondary | ICD-10-CM | POA: Diagnosis not present

## 2011-06-09 DIAGNOSIS — F339 Major depressive disorder, recurrent, unspecified: Secondary | ICD-10-CM | POA: Diagnosis not present

## 2011-06-09 DIAGNOSIS — I10 Essential (primary) hypertension: Secondary | ICD-10-CM | POA: Diagnosis not present

## 2011-07-03 DIAGNOSIS — R5383 Other fatigue: Secondary | ICD-10-CM | POA: Diagnosis not present

## 2011-07-03 DIAGNOSIS — R5381 Other malaise: Secondary | ICD-10-CM | POA: Diagnosis not present

## 2011-07-03 DIAGNOSIS — I10 Essential (primary) hypertension: Secondary | ICD-10-CM | POA: Diagnosis not present

## 2011-07-03 DIAGNOSIS — Z79899 Other long term (current) drug therapy: Secondary | ICD-10-CM | POA: Diagnosis not present

## 2011-07-03 DIAGNOSIS — F329 Major depressive disorder, single episode, unspecified: Secondary | ICD-10-CM | POA: Diagnosis not present

## 2011-07-15 DIAGNOSIS — R269 Unspecified abnormalities of gait and mobility: Secondary | ICD-10-CM | POA: Diagnosis not present

## 2011-07-29 DIAGNOSIS — G20 Parkinson's disease: Secondary | ICD-10-CM | POA: Diagnosis not present

## 2011-07-29 DIAGNOSIS — R5383 Other fatigue: Secondary | ICD-10-CM | POA: Diagnosis not present

## 2011-07-29 DIAGNOSIS — F329 Major depressive disorder, single episode, unspecified: Secondary | ICD-10-CM | POA: Diagnosis not present

## 2011-07-29 DIAGNOSIS — R5381 Other malaise: Secondary | ICD-10-CM | POA: Diagnosis not present

## 2011-07-31 DIAGNOSIS — G20 Parkinson's disease: Secondary | ICD-10-CM | POA: Diagnosis not present

## 2011-07-31 DIAGNOSIS — Z79899 Other long term (current) drug therapy: Secondary | ICD-10-CM | POA: Diagnosis not present

## 2011-07-31 DIAGNOSIS — R5383 Other fatigue: Secondary | ICD-10-CM | POA: Diagnosis not present

## 2011-07-31 DIAGNOSIS — F329 Major depressive disorder, single episode, unspecified: Secondary | ICD-10-CM | POA: Diagnosis not present

## 2011-07-31 DIAGNOSIS — R6889 Other general symptoms and signs: Secondary | ICD-10-CM | POA: Diagnosis not present

## 2011-08-07 DIAGNOSIS — G20 Parkinson's disease: Secondary | ICD-10-CM | POA: Diagnosis not present

## 2011-08-07 DIAGNOSIS — R5381 Other malaise: Secondary | ICD-10-CM | POA: Diagnosis not present

## 2011-08-07 DIAGNOSIS — I10 Essential (primary) hypertension: Secondary | ICD-10-CM | POA: Diagnosis not present

## 2011-08-07 DIAGNOSIS — F329 Major depressive disorder, single episode, unspecified: Secondary | ICD-10-CM | POA: Diagnosis not present

## 2011-08-07 DIAGNOSIS — R5383 Other fatigue: Secondary | ICD-10-CM | POA: Diagnosis not present

## 2011-08-11 DIAGNOSIS — F329 Major depressive disorder, single episode, unspecified: Secondary | ICD-10-CM | POA: Diagnosis not present

## 2011-08-11 DIAGNOSIS — R5381 Other malaise: Secondary | ICD-10-CM | POA: Diagnosis not present

## 2011-08-11 DIAGNOSIS — G20 Parkinson's disease: Secondary | ICD-10-CM | POA: Diagnosis not present

## 2011-08-11 DIAGNOSIS — I10 Essential (primary) hypertension: Secondary | ICD-10-CM | POA: Diagnosis not present

## 2011-08-13 DIAGNOSIS — G20 Parkinson's disease: Secondary | ICD-10-CM | POA: Diagnosis not present

## 2011-08-13 DIAGNOSIS — I10 Essential (primary) hypertension: Secondary | ICD-10-CM | POA: Diagnosis not present

## 2011-08-13 DIAGNOSIS — R5381 Other malaise: Secondary | ICD-10-CM | POA: Diagnosis not present

## 2011-08-13 DIAGNOSIS — F329 Major depressive disorder, single episode, unspecified: Secondary | ICD-10-CM | POA: Diagnosis not present

## 2011-08-18 DIAGNOSIS — G20 Parkinson's disease: Secondary | ICD-10-CM | POA: Diagnosis not present

## 2011-08-18 DIAGNOSIS — R5383 Other fatigue: Secondary | ICD-10-CM | POA: Diagnosis not present

## 2011-08-18 DIAGNOSIS — F329 Major depressive disorder, single episode, unspecified: Secondary | ICD-10-CM | POA: Diagnosis not present

## 2011-08-18 DIAGNOSIS — I10 Essential (primary) hypertension: Secondary | ICD-10-CM | POA: Diagnosis not present

## 2011-08-20 DIAGNOSIS — G20 Parkinson's disease: Secondary | ICD-10-CM | POA: Diagnosis not present

## 2011-08-20 DIAGNOSIS — I10 Essential (primary) hypertension: Secondary | ICD-10-CM | POA: Diagnosis not present

## 2011-08-20 DIAGNOSIS — F329 Major depressive disorder, single episode, unspecified: Secondary | ICD-10-CM | POA: Diagnosis not present

## 2011-08-20 DIAGNOSIS — R5383 Other fatigue: Secondary | ICD-10-CM | POA: Diagnosis not present

## 2011-08-25 DIAGNOSIS — R5383 Other fatigue: Secondary | ICD-10-CM | POA: Diagnosis not present

## 2011-08-25 DIAGNOSIS — T148XXA Other injury of unspecified body region, initial encounter: Secondary | ICD-10-CM | POA: Diagnosis not present

## 2011-08-25 DIAGNOSIS — G20 Parkinson's disease: Secondary | ICD-10-CM | POA: Diagnosis not present

## 2011-08-25 DIAGNOSIS — Z23 Encounter for immunization: Secondary | ICD-10-CM | POA: Diagnosis not present

## 2011-08-25 DIAGNOSIS — F329 Major depressive disorder, single episode, unspecified: Secondary | ICD-10-CM | POA: Diagnosis not present

## 2011-08-26 DIAGNOSIS — G20 Parkinson's disease: Secondary | ICD-10-CM | POA: Diagnosis not present

## 2011-08-26 DIAGNOSIS — I10 Essential (primary) hypertension: Secondary | ICD-10-CM | POA: Diagnosis not present

## 2011-08-26 DIAGNOSIS — F329 Major depressive disorder, single episode, unspecified: Secondary | ICD-10-CM | POA: Diagnosis not present

## 2011-08-26 DIAGNOSIS — R5381 Other malaise: Secondary | ICD-10-CM | POA: Diagnosis not present

## 2011-09-11 DIAGNOSIS — S81809A Unspecified open wound, unspecified lower leg, initial encounter: Secondary | ICD-10-CM | POA: Diagnosis not present

## 2011-09-29 DIAGNOSIS — R279 Unspecified lack of coordination: Secondary | ICD-10-CM | POA: Diagnosis not present

## 2011-09-29 DIAGNOSIS — M6281 Muscle weakness (generalized): Secondary | ICD-10-CM | POA: Diagnosis not present

## 2011-10-12 DIAGNOSIS — M6281 Muscle weakness (generalized): Secondary | ICD-10-CM | POA: Diagnosis not present

## 2011-10-12 DIAGNOSIS — R279 Unspecified lack of coordination: Secondary | ICD-10-CM | POA: Diagnosis not present

## 2011-10-13 DIAGNOSIS — M6281 Muscle weakness (generalized): Secondary | ICD-10-CM | POA: Diagnosis not present

## 2011-10-13 DIAGNOSIS — R279 Unspecified lack of coordination: Secondary | ICD-10-CM | POA: Diagnosis not present

## 2011-10-22 DIAGNOSIS — M6281 Muscle weakness (generalized): Secondary | ICD-10-CM | POA: Diagnosis not present

## 2011-10-22 DIAGNOSIS — R279 Unspecified lack of coordination: Secondary | ICD-10-CM | POA: Diagnosis not present

## 2011-10-26 DIAGNOSIS — R279 Unspecified lack of coordination: Secondary | ICD-10-CM | POA: Diagnosis not present

## 2011-10-26 DIAGNOSIS — M6281 Muscle weakness (generalized): Secondary | ICD-10-CM | POA: Diagnosis not present

## 2011-10-27 DIAGNOSIS — R279 Unspecified lack of coordination: Secondary | ICD-10-CM | POA: Diagnosis not present

## 2011-10-27 DIAGNOSIS — M6281 Muscle weakness (generalized): Secondary | ICD-10-CM | POA: Diagnosis not present

## 2011-10-30 DIAGNOSIS — I10 Essential (primary) hypertension: Secondary | ICD-10-CM | POA: Diagnosis not present

## 2011-10-30 DIAGNOSIS — R269 Unspecified abnormalities of gait and mobility: Secondary | ICD-10-CM | POA: Diagnosis not present

## 2011-10-30 DIAGNOSIS — F411 Generalized anxiety disorder: Secondary | ICD-10-CM | POA: Diagnosis not present

## 2011-10-30 DIAGNOSIS — F329 Major depressive disorder, single episode, unspecified: Secondary | ICD-10-CM | POA: Diagnosis not present

## 2011-10-30 DIAGNOSIS — R279 Unspecified lack of coordination: Secondary | ICD-10-CM | POA: Diagnosis not present

## 2011-10-30 DIAGNOSIS — Z79899 Other long term (current) drug therapy: Secondary | ICD-10-CM | POA: Diagnosis not present

## 2011-10-30 DIAGNOSIS — M6281 Muscle weakness (generalized): Secondary | ICD-10-CM | POA: Diagnosis not present

## 2011-11-02 DIAGNOSIS — M6281 Muscle weakness (generalized): Secondary | ICD-10-CM | POA: Diagnosis not present

## 2011-11-02 DIAGNOSIS — R279 Unspecified lack of coordination: Secondary | ICD-10-CM | POA: Diagnosis not present

## 2011-11-06 DIAGNOSIS — M6281 Muscle weakness (generalized): Secondary | ICD-10-CM | POA: Diagnosis not present

## 2011-11-06 DIAGNOSIS — R279 Unspecified lack of coordination: Secondary | ICD-10-CM | POA: Diagnosis not present

## 2011-11-09 DIAGNOSIS — M6281 Muscle weakness (generalized): Secondary | ICD-10-CM | POA: Diagnosis not present

## 2011-11-09 DIAGNOSIS — R279 Unspecified lack of coordination: Secondary | ICD-10-CM | POA: Diagnosis not present

## 2011-11-16 DIAGNOSIS — R279 Unspecified lack of coordination: Secondary | ICD-10-CM | POA: Diagnosis not present

## 2011-11-16 DIAGNOSIS — M6281 Muscle weakness (generalized): Secondary | ICD-10-CM | POA: Diagnosis not present

## 2011-11-18 DIAGNOSIS — Z23 Encounter for immunization: Secondary | ICD-10-CM | POA: Diagnosis not present

## 2011-11-20 DIAGNOSIS — R279 Unspecified lack of coordination: Secondary | ICD-10-CM | POA: Diagnosis not present

## 2011-11-20 DIAGNOSIS — R269 Unspecified abnormalities of gait and mobility: Secondary | ICD-10-CM | POA: Diagnosis not present

## 2011-11-20 DIAGNOSIS — Z9181 History of falling: Secondary | ICD-10-CM | POA: Diagnosis not present

## 2011-11-20 DIAGNOSIS — M6281 Muscle weakness (generalized): Secondary | ICD-10-CM | POA: Diagnosis not present

## 2011-12-18 DIAGNOSIS — R269 Unspecified abnormalities of gait and mobility: Secondary | ICD-10-CM | POA: Diagnosis not present

## 2011-12-18 DIAGNOSIS — R279 Unspecified lack of coordination: Secondary | ICD-10-CM | POA: Diagnosis not present

## 2011-12-18 DIAGNOSIS — M6281 Muscle weakness (generalized): Secondary | ICD-10-CM | POA: Diagnosis not present

## 2011-12-18 DIAGNOSIS — Z9181 History of falling: Secondary | ICD-10-CM | POA: Diagnosis not present

## 2011-12-23 DIAGNOSIS — H6120 Impacted cerumen, unspecified ear: Secondary | ICD-10-CM | POA: Diagnosis not present

## 2012-01-20 DIAGNOSIS — R269 Unspecified abnormalities of gait and mobility: Secondary | ICD-10-CM | POA: Diagnosis not present

## 2012-01-20 DIAGNOSIS — Z9181 History of falling: Secondary | ICD-10-CM | POA: Diagnosis not present

## 2012-01-20 DIAGNOSIS — M6281 Muscle weakness (generalized): Secondary | ICD-10-CM | POA: Diagnosis not present

## 2012-01-20 DIAGNOSIS — R279 Unspecified lack of coordination: Secondary | ICD-10-CM | POA: Diagnosis not present

## 2012-01-21 DIAGNOSIS — Z9181 History of falling: Secondary | ICD-10-CM | POA: Diagnosis not present

## 2012-01-21 DIAGNOSIS — R269 Unspecified abnormalities of gait and mobility: Secondary | ICD-10-CM | POA: Diagnosis not present

## 2012-01-21 DIAGNOSIS — M6281 Muscle weakness (generalized): Secondary | ICD-10-CM | POA: Diagnosis not present

## 2012-01-21 DIAGNOSIS — R279 Unspecified lack of coordination: Secondary | ICD-10-CM | POA: Diagnosis not present

## 2012-01-22 DIAGNOSIS — R279 Unspecified lack of coordination: Secondary | ICD-10-CM | POA: Diagnosis not present

## 2012-01-22 DIAGNOSIS — M6281 Muscle weakness (generalized): Secondary | ICD-10-CM | POA: Diagnosis not present

## 2012-01-22 DIAGNOSIS — Z9181 History of falling: Secondary | ICD-10-CM | POA: Diagnosis not present

## 2012-01-22 DIAGNOSIS — R269 Unspecified abnormalities of gait and mobility: Secondary | ICD-10-CM | POA: Diagnosis not present

## 2012-01-25 DIAGNOSIS — R269 Unspecified abnormalities of gait and mobility: Secondary | ICD-10-CM | POA: Diagnosis not present

## 2012-01-25 DIAGNOSIS — M6281 Muscle weakness (generalized): Secondary | ICD-10-CM | POA: Diagnosis not present

## 2012-01-25 DIAGNOSIS — R279 Unspecified lack of coordination: Secondary | ICD-10-CM | POA: Diagnosis not present

## 2012-01-25 DIAGNOSIS — Z9181 History of falling: Secondary | ICD-10-CM | POA: Diagnosis not present

## 2012-01-28 DIAGNOSIS — R279 Unspecified lack of coordination: Secondary | ICD-10-CM | POA: Diagnosis not present

## 2012-01-28 DIAGNOSIS — Z9181 History of falling: Secondary | ICD-10-CM | POA: Diagnosis not present

## 2012-01-28 DIAGNOSIS — R269 Unspecified abnormalities of gait and mobility: Secondary | ICD-10-CM | POA: Diagnosis not present

## 2012-01-28 DIAGNOSIS — M6281 Muscle weakness (generalized): Secondary | ICD-10-CM | POA: Diagnosis not present

## 2012-02-01 DIAGNOSIS — M6281 Muscle weakness (generalized): Secondary | ICD-10-CM | POA: Diagnosis not present

## 2012-02-01 DIAGNOSIS — R269 Unspecified abnormalities of gait and mobility: Secondary | ICD-10-CM | POA: Diagnosis not present

## 2012-02-01 DIAGNOSIS — R279 Unspecified lack of coordination: Secondary | ICD-10-CM | POA: Diagnosis not present

## 2012-02-01 DIAGNOSIS — Z9181 History of falling: Secondary | ICD-10-CM | POA: Diagnosis not present

## 2012-02-03 DIAGNOSIS — R279 Unspecified lack of coordination: Secondary | ICD-10-CM | POA: Diagnosis not present

## 2012-02-03 DIAGNOSIS — Z9181 History of falling: Secondary | ICD-10-CM | POA: Diagnosis not present

## 2012-02-03 DIAGNOSIS — M6281 Muscle weakness (generalized): Secondary | ICD-10-CM | POA: Diagnosis not present

## 2012-02-03 DIAGNOSIS — R269 Unspecified abnormalities of gait and mobility: Secondary | ICD-10-CM | POA: Diagnosis not present

## 2012-02-16 DIAGNOSIS — M6281 Muscle weakness (generalized): Secondary | ICD-10-CM | POA: Diagnosis not present

## 2012-02-16 DIAGNOSIS — R269 Unspecified abnormalities of gait and mobility: Secondary | ICD-10-CM | POA: Diagnosis not present

## 2012-02-16 DIAGNOSIS — Z9181 History of falling: Secondary | ICD-10-CM | POA: Diagnosis not present

## 2012-02-16 DIAGNOSIS — R279 Unspecified lack of coordination: Secondary | ICD-10-CM | POA: Diagnosis not present

## 2012-02-19 DIAGNOSIS — R269 Unspecified abnormalities of gait and mobility: Secondary | ICD-10-CM | POA: Diagnosis not present

## 2012-02-19 DIAGNOSIS — Z9181 History of falling: Secondary | ICD-10-CM | POA: Diagnosis not present

## 2012-02-19 DIAGNOSIS — R279 Unspecified lack of coordination: Secondary | ICD-10-CM | POA: Diagnosis not present

## 2012-02-19 DIAGNOSIS — M6281 Muscle weakness (generalized): Secondary | ICD-10-CM | POA: Diagnosis not present

## 2012-02-23 DIAGNOSIS — R279 Unspecified lack of coordination: Secondary | ICD-10-CM | POA: Diagnosis not present

## 2012-02-23 DIAGNOSIS — M6281 Muscle weakness (generalized): Secondary | ICD-10-CM | POA: Diagnosis not present

## 2012-02-23 DIAGNOSIS — Z9181 History of falling: Secondary | ICD-10-CM | POA: Diagnosis not present

## 2012-02-23 DIAGNOSIS — R269 Unspecified abnormalities of gait and mobility: Secondary | ICD-10-CM | POA: Diagnosis not present

## 2012-02-24 DIAGNOSIS — Z9181 History of falling: Secondary | ICD-10-CM | POA: Diagnosis not present

## 2012-02-24 DIAGNOSIS — R279 Unspecified lack of coordination: Secondary | ICD-10-CM | POA: Diagnosis not present

## 2012-02-24 DIAGNOSIS — R269 Unspecified abnormalities of gait and mobility: Secondary | ICD-10-CM | POA: Diagnosis not present

## 2012-02-24 DIAGNOSIS — M6281 Muscle weakness (generalized): Secondary | ICD-10-CM | POA: Diagnosis not present

## 2012-02-25 DIAGNOSIS — M6281 Muscle weakness (generalized): Secondary | ICD-10-CM | POA: Diagnosis not present

## 2012-02-25 DIAGNOSIS — R279 Unspecified lack of coordination: Secondary | ICD-10-CM | POA: Diagnosis not present

## 2012-02-25 DIAGNOSIS — R269 Unspecified abnormalities of gait and mobility: Secondary | ICD-10-CM | POA: Diagnosis not present

## 2012-02-25 DIAGNOSIS — Z9181 History of falling: Secondary | ICD-10-CM | POA: Diagnosis not present

## 2012-02-26 DIAGNOSIS — Z79899 Other long term (current) drug therapy: Secondary | ICD-10-CM | POA: Diagnosis not present

## 2012-02-26 DIAGNOSIS — I10 Essential (primary) hypertension: Secondary | ICD-10-CM | POA: Diagnosis not present

## 2012-02-26 DIAGNOSIS — F339 Major depressive disorder, recurrent, unspecified: Secondary | ICD-10-CM | POA: Diagnosis not present

## 2012-02-26 DIAGNOSIS — R32 Unspecified urinary incontinence: Secondary | ICD-10-CM | POA: Diagnosis not present

## 2012-03-07 DIAGNOSIS — Z9181 History of falling: Secondary | ICD-10-CM | POA: Diagnosis not present

## 2012-03-07 DIAGNOSIS — M6281 Muscle weakness (generalized): Secondary | ICD-10-CM | POA: Diagnosis not present

## 2012-03-07 DIAGNOSIS — R279 Unspecified lack of coordination: Secondary | ICD-10-CM | POA: Diagnosis not present

## 2012-03-07 DIAGNOSIS — R269 Unspecified abnormalities of gait and mobility: Secondary | ICD-10-CM | POA: Diagnosis not present

## 2012-03-10 DIAGNOSIS — M6281 Muscle weakness (generalized): Secondary | ICD-10-CM | POA: Diagnosis not present

## 2012-03-10 DIAGNOSIS — Z9181 History of falling: Secondary | ICD-10-CM | POA: Diagnosis not present

## 2012-03-10 DIAGNOSIS — R279 Unspecified lack of coordination: Secondary | ICD-10-CM | POA: Diagnosis not present

## 2012-03-10 DIAGNOSIS — R269 Unspecified abnormalities of gait and mobility: Secondary | ICD-10-CM | POA: Diagnosis not present

## 2012-03-14 DIAGNOSIS — N3941 Urge incontinence: Secondary | ICD-10-CM | POA: Diagnosis not present

## 2012-03-14 DIAGNOSIS — R351 Nocturia: Secondary | ICD-10-CM | POA: Diagnosis not present

## 2012-03-15 DIAGNOSIS — R269 Unspecified abnormalities of gait and mobility: Secondary | ICD-10-CM | POA: Diagnosis not present

## 2012-03-15 DIAGNOSIS — R279 Unspecified lack of coordination: Secondary | ICD-10-CM | POA: Diagnosis not present

## 2012-03-15 DIAGNOSIS — Z9181 History of falling: Secondary | ICD-10-CM | POA: Diagnosis not present

## 2012-03-15 DIAGNOSIS — M6281 Muscle weakness (generalized): Secondary | ICD-10-CM | POA: Diagnosis not present

## 2012-03-23 DIAGNOSIS — R279 Unspecified lack of coordination: Secondary | ICD-10-CM | POA: Diagnosis not present

## 2012-03-23 DIAGNOSIS — M6281 Muscle weakness (generalized): Secondary | ICD-10-CM | POA: Diagnosis not present

## 2012-04-04 DIAGNOSIS — H019 Unspecified inflammation of eyelid: Secondary | ICD-10-CM | POA: Diagnosis not present

## 2012-04-11 DIAGNOSIS — I10 Essential (primary) hypertension: Secondary | ICD-10-CM | POA: Diagnosis not present

## 2012-04-11 DIAGNOSIS — H00019 Hordeolum externum unspecified eye, unspecified eyelid: Secondary | ICD-10-CM | POA: Diagnosis not present

## 2012-04-12 DIAGNOSIS — R279 Unspecified lack of coordination: Secondary | ICD-10-CM | POA: Diagnosis not present

## 2012-04-12 DIAGNOSIS — M6281 Muscle weakness (generalized): Secondary | ICD-10-CM | POA: Diagnosis not present

## 2012-04-14 DIAGNOSIS — M6281 Muscle weakness (generalized): Secondary | ICD-10-CM | POA: Diagnosis not present

## 2012-04-14 DIAGNOSIS — R279 Unspecified lack of coordination: Secondary | ICD-10-CM | POA: Diagnosis not present

## 2012-04-20 DIAGNOSIS — N3941 Urge incontinence: Secondary | ICD-10-CM | POA: Diagnosis not present

## 2012-04-20 DIAGNOSIS — R351 Nocturia: Secondary | ICD-10-CM | POA: Diagnosis not present

## 2012-04-20 DIAGNOSIS — M6281 Muscle weakness (generalized): Secondary | ICD-10-CM | POA: Diagnosis not present

## 2012-04-20 DIAGNOSIS — R279 Unspecified lack of coordination: Secondary | ICD-10-CM | POA: Diagnosis not present

## 2012-04-20 DIAGNOSIS — R350 Frequency of micturition: Secondary | ICD-10-CM | POA: Diagnosis not present

## 2012-04-27 DIAGNOSIS — R351 Nocturia: Secondary | ICD-10-CM | POA: Diagnosis not present

## 2012-04-27 DIAGNOSIS — N3941 Urge incontinence: Secondary | ICD-10-CM | POA: Diagnosis not present

## 2012-04-28 DIAGNOSIS — R279 Unspecified lack of coordination: Secondary | ICD-10-CM | POA: Diagnosis not present

## 2012-04-28 DIAGNOSIS — M6281 Muscle weakness (generalized): Secondary | ICD-10-CM | POA: Diagnosis not present

## 2012-05-02 DIAGNOSIS — I10 Essential (primary) hypertension: Secondary | ICD-10-CM | POA: Diagnosis not present

## 2012-05-02 DIAGNOSIS — R279 Unspecified lack of coordination: Secondary | ICD-10-CM | POA: Diagnosis not present

## 2012-05-02 DIAGNOSIS — I359 Nonrheumatic aortic valve disorder, unspecified: Secondary | ICD-10-CM | POA: Diagnosis not present

## 2012-05-02 DIAGNOSIS — M6281 Muscle weakness (generalized): Secondary | ICD-10-CM | POA: Diagnosis not present

## 2012-05-02 DIAGNOSIS — I059 Rheumatic mitral valve disease, unspecified: Secondary | ICD-10-CM | POA: Diagnosis not present

## 2012-05-09 DIAGNOSIS — R279 Unspecified lack of coordination: Secondary | ICD-10-CM | POA: Diagnosis not present

## 2012-05-09 DIAGNOSIS — M6281 Muscle weakness (generalized): Secondary | ICD-10-CM | POA: Diagnosis not present

## 2012-06-06 DIAGNOSIS — R351 Nocturia: Secondary | ICD-10-CM | POA: Diagnosis not present

## 2012-06-06 DIAGNOSIS — N3941 Urge incontinence: Secondary | ICD-10-CM | POA: Diagnosis not present

## 2012-06-23 DIAGNOSIS — M6281 Muscle weakness (generalized): Secondary | ICD-10-CM | POA: Diagnosis not present

## 2012-06-23 DIAGNOSIS — R279 Unspecified lack of coordination: Secondary | ICD-10-CM | POA: Diagnosis not present

## 2012-06-29 ENCOUNTER — Other Ambulatory Visit: Payer: Self-pay | Admitting: Diagnostic Neuroimaging

## 2012-06-30 DIAGNOSIS — R279 Unspecified lack of coordination: Secondary | ICD-10-CM | POA: Diagnosis not present

## 2012-06-30 DIAGNOSIS — M6281 Muscle weakness (generalized): Secondary | ICD-10-CM | POA: Diagnosis not present

## 2012-07-04 DIAGNOSIS — R279 Unspecified lack of coordination: Secondary | ICD-10-CM | POA: Diagnosis not present

## 2012-07-04 DIAGNOSIS — M6281 Muscle weakness (generalized): Secondary | ICD-10-CM | POA: Diagnosis not present

## 2012-07-06 DIAGNOSIS — M6281 Muscle weakness (generalized): Secondary | ICD-10-CM | POA: Diagnosis not present

## 2012-07-06 DIAGNOSIS — R279 Unspecified lack of coordination: Secondary | ICD-10-CM | POA: Diagnosis not present

## 2012-07-07 DIAGNOSIS — R279 Unspecified lack of coordination: Secondary | ICD-10-CM | POA: Diagnosis not present

## 2012-07-07 DIAGNOSIS — M6281 Muscle weakness (generalized): Secondary | ICD-10-CM | POA: Diagnosis not present

## 2012-07-13 DIAGNOSIS — M6281 Muscle weakness (generalized): Secondary | ICD-10-CM | POA: Diagnosis not present

## 2012-07-13 DIAGNOSIS — R279 Unspecified lack of coordination: Secondary | ICD-10-CM | POA: Diagnosis not present

## 2012-07-19 DIAGNOSIS — M6281 Muscle weakness (generalized): Secondary | ICD-10-CM | POA: Diagnosis not present

## 2012-07-19 DIAGNOSIS — R279 Unspecified lack of coordination: Secondary | ICD-10-CM | POA: Diagnosis not present

## 2012-08-17 DIAGNOSIS — M6281 Muscle weakness (generalized): Secondary | ICD-10-CM | POA: Diagnosis not present

## 2012-08-17 DIAGNOSIS — N3941 Urge incontinence: Secondary | ICD-10-CM | POA: Diagnosis not present

## 2012-08-17 DIAGNOSIS — R351 Nocturia: Secondary | ICD-10-CM | POA: Diagnosis not present

## 2012-08-17 DIAGNOSIS — R279 Unspecified lack of coordination: Secondary | ICD-10-CM | POA: Diagnosis not present

## 2012-08-26 DIAGNOSIS — I10 Essential (primary) hypertension: Secondary | ICD-10-CM | POA: Diagnosis not present

## 2012-08-26 DIAGNOSIS — F339 Major depressive disorder, recurrent, unspecified: Secondary | ICD-10-CM | POA: Diagnosis not present

## 2012-08-26 DIAGNOSIS — R269 Unspecified abnormalities of gait and mobility: Secondary | ICD-10-CM | POA: Diagnosis not present

## 2012-08-26 DIAGNOSIS — Z79899 Other long term (current) drug therapy: Secondary | ICD-10-CM | POA: Diagnosis not present

## 2012-08-26 DIAGNOSIS — Z7189 Other specified counseling: Secondary | ICD-10-CM | POA: Diagnosis not present

## 2012-09-06 ENCOUNTER — Other Ambulatory Visit: Payer: Self-pay | Admitting: Diagnostic Neuroimaging

## 2012-09-12 ENCOUNTER — Ambulatory Visit (INDEPENDENT_AMBULATORY_CARE_PROVIDER_SITE_OTHER): Payer: Medicare Other | Admitting: Diagnostic Neuroimaging

## 2012-09-12 ENCOUNTER — Encounter: Payer: Self-pay | Admitting: Diagnostic Neuroimaging

## 2012-09-12 ENCOUNTER — Telehealth: Payer: Self-pay | Admitting: Diagnostic Neuroimaging

## 2012-09-12 VITALS — BP 141/69 | HR 69 | Temp 97.9°F

## 2012-09-12 DIAGNOSIS — G20C Parkinsonism, unspecified: Secondary | ICD-10-CM | POA: Insufficient documentation

## 2012-09-12 DIAGNOSIS — G20 Parkinson's disease: Secondary | ICD-10-CM

## 2012-09-12 DIAGNOSIS — R269 Unspecified abnormalities of gait and mobility: Secondary | ICD-10-CM | POA: Diagnosis not present

## 2012-09-12 MED ORDER — CARBIDOPA-LEVODOPA 25-100 MG PO TABS: 2.0000 | ORAL_TABLET | Freq: Three times a day (TID) | ORAL | Status: DC

## 2012-09-12 NOTE — Telephone Encounter (Signed)
Faxed at 209 pm.

## 2012-09-12 NOTE — Patient Instructions (Signed)
Increase carbidopa/levodopa to 1.5 tabs three times a day x 2 weeks, then increase to 2 tabs three times per day.  Consider transition to assisted living.

## 2012-09-12 NOTE — Telephone Encounter (Signed)
Spoke to MIA and explained the med change on the Carbidopa-Levodopa. She understands and would like a copy faxed to [phone] attn: MIA so that it could go in his chart.

## 2012-09-12 NOTE — Progress Notes (Signed)
[facility]  PATIENT: Ricardo [name] DOB: 1921-05-17  REFERRING CLINICIAN:  HISTORY FROM: patient and daughter REASON FOR VISIT: routine   HISTORICAL  CHIEF COMPLAINT:  Chief Complaint  Patient presents with  . Gait Problem    difficulty walking    HISTORY OF PRESENT ILLNESS:   UPDATE 09/12/12: Since last visit, not much benefit with carb/levo. More falls. Still in independent living. Barely able to stand. Has freezing gait. Still showers standing up, with great fear of falling. Multiple falls, at kitchen, bathroom, outside. Mainly wheelchair bound now.  PRIOR HPI (07/29/11): 79 year old male with history of polio affecting the right leg 1953, depression, anxiety, hypertension, here for evaluation of progressive deterioration.  Patient reports history of polio affecting his right leg at age 75 or 79 years old. He had 90% recovery of function. Starting 5 years ago, patient started using a cane for balance. 2 years ago he started using a walker. Since that time has had continued progressive gait deterioration, decreased energy, malaise. He is taking increasingly short shuffling steps. His handwriting is poor. His mild intermittent tremor. He lives in independent living at [place] with his wife who has Alzheimer's disease. Patient has had multiple falls.    REVIEW OF SYSTEMS: Full 14 system review of systems performed and notable only for memory loss confusion weakness slurred speech sleepiness depression anxiety decreased energy disinterest in activities incontinence hearing loss fatigue.  ALLERGIES: Allergies  Allergen Reactions  . Penicillins   . Sulfa Antibiotics     HOME MEDICATIONS: Outpatient Prescriptions Prior to Visit  Medication Sig Dispense Refill  . carbidopa-levodopa (SINEMET IR) 25-100 MG per tablet TAKE ONE TABLET THREE TIMES DAILY  90 tablet  0   No facility-administered medications prior to visit.    PAST MEDICAL HISTORY: Past  Medical History  Diagnosis Date  . Hypertension   . Polio   . Depression   . Cancer   . Prostate ca   . Stroke     PAST SURGICAL HISTORY: Past Surgical History  Procedure Laterality Date  . Knee surgery      FAMILY HISTORY: Family History  Problem Relation Age of Onset  . Heart disease Mother   . Heart disease Father     SOCIAL HISTORY:  History   Social History  . Marital Status: Married    Spouse Name: Santos Sollenberger    Number of Children: 2  . Years of Education: College   Occupational History  .     Social History Main Topics  . Smoking status: Never Smoker   . Smokeless tobacco: Never Used  . Alcohol Use: Yes     Comment: 1 glass of alcohol daily  . Drug Use: No  . Sexually Active: Not on file   Other Topics Concern  . Not on file   Social History Narrative   Patient lives at home with his spouse.   Caffeine Use: 3 cups of caffeine daily.     PHYSICAL EXAM  Filed Vitals:   09/12/12 1225  BP: 141/69  Pulse: 69  Temp: 97.9 F (36.6 C)  TempSrc: Oral    Not recorded    Cannot calculate BMI with a height equal to zero.  GENERAL EXAM: General: Patient is awake, alert and in no acute distress.  Well developed and groomed. MASKED FACIES. Cardiovascular: No carotid artery bruits.  Heart is regular rate and rhythm with no murmurs.  Neurologic Exam  Mental Status: Awake, alert. Language is fluent and comprehension intact.  POSITIVE MYERSONS. NEG SNOUT. Cranial Nerves: No evidence of papilledema on funduscopic exam.  Pupils are equal and reactive to light.  Visual fields are full to confrontation.  Conjugate eye movements are full and symmetric.  Facial sensation and strength are symmetric.  Hearing is intact.  Palate elevated symmetrically and uvula is midline.  Shoulder shrug is symmetric.  Tongue is midline. Motor: Normal bulk. POSTURAL TREMOR OF BUE. COGWHEELING IN BUE. MILD BRADYKINESIA IN BUE. MODERATE BRADYKINESIA IN BLE. BUE 4. BLE 3.    Sensory: Intact and symmetric to light touch Coordination: No ataxia or dysmetria on finger-nose or rapid alternating movement testing. Gait and Station: [employer] FROM [employer].  Reflexes: Deep tendon reflexes in the upper and lower extremity are TRACE.   DIAGNOSTIC DATA (LABS, IMAGING, TESTING) - I reviewed patient records, labs, notes, testing and imaging myself where available.  Lab Results  Component Value Date   WBC 9.9 09/26/2010   HGB 16.8 09/26/2010   HCT 46.4 09/26/2010   MCV 92.6 09/26/2010   PLT 212 09/26/2010      Component Value Date/Time   NA 139 09/26/2010 0610   K 3.7 09/26/2010 0610   CL 103 09/26/2010 0610   CO2 25 09/26/2010 0610   GLUCOSE 98 09/26/2010 0610   BUN 19 09/26/2010 0610   CREATININE 0.83 09/26/2010 0610   CALCIUM 9.3 09/26/2010 0610   PROT 7.2 09/26/2010 0610   ALBUMIN 3.7 09/26/2010 0610   AST 20 09/26/2010 0610   ALT 7 09/26/2010 0610   ALKPHOS 61 09/26/2010 0610   BILITOT 0.9 09/26/2010 0610   GFRNONAA >60 09/26/2010 0610   GFRAA >60 09/26/2010 0610   Lab Results  Component Value Date   CHOL 172 09/17/2010   HDL 59 09/17/2010   LDLCALC 161* 09/17/2010   TRIG 52 09/17/2010   CHOLHDL 2.9 09/17/2010   Lab Results  Component Value Date   HGBA1C 5.6 09/17/2010   No results found for this basename: VITAMINB12   Lab Results  Component Value Date   TSH 2.148 09/26/2010    09/17/10 MRI brain - moderate perisylvian and temporal atrophy; mild small vessel ischemic disease    ASSESSMENT AND PLAN  79 y.o. male with hypertension, depression, prior stroke, here for evaluation/mgmt of progressive gait deterioration since 2011. Exam notable for frontal release signs, masked facies, cogwheeling rigidity, short shuffling steps.   Ddx: Parkinson's disease, vascular parkinsonism, neurodegenerative, arthritis, spinal stenosis  PLAN: 1. Increase carbidopa levodopa 2. I offered to check MRI brain and cervical spine but patient and daughter would like to hold off for  now 3. Consider transition to assisted living 4. Manual wheel chair rx given; handcap parking tag signed 5. Use bench in shower   Suanne Marker, MD 09/12/2012, 1:22 PM Certified in Neurology, Neurophysiology and Neuroimaging  [facility] [address], [address] [place], [place] [address] [phone]

## 2012-09-14 DIAGNOSIS — H6120 Impacted cerumen, unspecified ear: Secondary | ICD-10-CM | POA: Diagnosis not present

## 2012-09-15 ENCOUNTER — Telehealth: Payer: Self-pay | Admitting: *Deleted

## 2012-09-15 NOTE — Telephone Encounter (Signed)
Mailed rx for DME manual wheelchair.

## 2012-09-19 DIAGNOSIS — N3941 Urge incontinence: Secondary | ICD-10-CM | POA: Diagnosis not present

## 2012-09-19 DIAGNOSIS — R350 Frequency of micturition: Secondary | ICD-10-CM | POA: Diagnosis not present

## 2012-09-20 DIAGNOSIS — R269 Unspecified abnormalities of gait and mobility: Secondary | ICD-10-CM | POA: Diagnosis not present

## 2012-09-20 DIAGNOSIS — M6281 Muscle weakness (generalized): Secondary | ICD-10-CM | POA: Diagnosis not present

## 2012-09-20 DIAGNOSIS — R279 Unspecified lack of coordination: Secondary | ICD-10-CM | POA: Diagnosis not present

## 2012-09-23 DIAGNOSIS — M6281 Muscle weakness (generalized): Secondary | ICD-10-CM | POA: Diagnosis not present

## 2012-09-23 DIAGNOSIS — R279 Unspecified lack of coordination: Secondary | ICD-10-CM | POA: Diagnosis not present

## 2012-09-23 DIAGNOSIS — R269 Unspecified abnormalities of gait and mobility: Secondary | ICD-10-CM | POA: Diagnosis not present

## 2012-09-27 DIAGNOSIS — Z111 Encounter for screening for respiratory tuberculosis: Secondary | ICD-10-CM | POA: Diagnosis not present

## 2012-09-28 DIAGNOSIS — R279 Unspecified lack of coordination: Secondary | ICD-10-CM | POA: Diagnosis not present

## 2012-09-28 DIAGNOSIS — M6281 Muscle weakness (generalized): Secondary | ICD-10-CM | POA: Diagnosis not present

## 2012-09-28 DIAGNOSIS — R269 Unspecified abnormalities of gait and mobility: Secondary | ICD-10-CM | POA: Diagnosis not present

## 2012-09-29 DIAGNOSIS — R279 Unspecified lack of coordination: Secondary | ICD-10-CM | POA: Diagnosis not present

## 2012-09-29 DIAGNOSIS — R269 Unspecified abnormalities of gait and mobility: Secondary | ICD-10-CM | POA: Diagnosis not present

## 2012-09-29 DIAGNOSIS — M6281 Muscle weakness (generalized): Secondary | ICD-10-CM | POA: Diagnosis not present

## 2012-10-03 DIAGNOSIS — N3941 Urge incontinence: Secondary | ICD-10-CM | POA: Diagnosis not present

## 2012-10-10 DIAGNOSIS — R350 Frequency of micturition: Secondary | ICD-10-CM | POA: Diagnosis not present

## 2012-10-10 DIAGNOSIS — N3941 Urge incontinence: Secondary | ICD-10-CM | POA: Diagnosis not present

## 2012-10-11 DIAGNOSIS — R269 Unspecified abnormalities of gait and mobility: Secondary | ICD-10-CM | POA: Diagnosis not present

## 2012-10-11 DIAGNOSIS — R279 Unspecified lack of coordination: Secondary | ICD-10-CM | POA: Diagnosis not present

## 2012-10-11 DIAGNOSIS — M6281 Muscle weakness (generalized): Secondary | ICD-10-CM | POA: Diagnosis not present

## 2012-10-13 DIAGNOSIS — R269 Unspecified abnormalities of gait and mobility: Secondary | ICD-10-CM | POA: Diagnosis not present

## 2012-10-13 DIAGNOSIS — M6281 Muscle weakness (generalized): Secondary | ICD-10-CM | POA: Diagnosis not present

## 2012-10-13 DIAGNOSIS — R279 Unspecified lack of coordination: Secondary | ICD-10-CM | POA: Diagnosis not present

## 2012-10-19 DIAGNOSIS — I059 Rheumatic mitral valve disease, unspecified: Secondary | ICD-10-CM | POA: Diagnosis not present

## 2012-10-19 DIAGNOSIS — R5381 Other malaise: Secondary | ICD-10-CM | POA: Diagnosis not present

## 2012-10-19 DIAGNOSIS — F329 Major depressive disorder, single episode, unspecified: Secondary | ICD-10-CM | POA: Diagnosis not present

## 2012-10-19 DIAGNOSIS — Z79899 Other long term (current) drug therapy: Secondary | ICD-10-CM | POA: Diagnosis not present

## 2012-10-19 DIAGNOSIS — I10 Essential (primary) hypertension: Secondary | ICD-10-CM | POA: Diagnosis not present

## 2012-10-20 DIAGNOSIS — R279 Unspecified lack of coordination: Secondary | ICD-10-CM | POA: Diagnosis not present

## 2012-10-20 DIAGNOSIS — R269 Unspecified abnormalities of gait and mobility: Secondary | ICD-10-CM | POA: Diagnosis not present

## 2012-10-20 DIAGNOSIS — M6281 Muscle weakness (generalized): Secondary | ICD-10-CM | POA: Diagnosis not present

## 2012-10-21 DIAGNOSIS — R279 Unspecified lack of coordination: Secondary | ICD-10-CM | POA: Diagnosis not present

## 2012-10-21 DIAGNOSIS — M6281 Muscle weakness (generalized): Secondary | ICD-10-CM | POA: Diagnosis not present

## 2012-10-21 DIAGNOSIS — R269 Unspecified abnormalities of gait and mobility: Secondary | ICD-10-CM | POA: Diagnosis not present

## 2012-10-24 DIAGNOSIS — N3941 Urge incontinence: Secondary | ICD-10-CM | POA: Diagnosis not present

## 2012-10-24 DIAGNOSIS — R350 Frequency of micturition: Secondary | ICD-10-CM | POA: Diagnosis not present

## 2012-10-25 DIAGNOSIS — R269 Unspecified abnormalities of gait and mobility: Secondary | ICD-10-CM | POA: Diagnosis not present

## 2012-10-25 DIAGNOSIS — R279 Unspecified lack of coordination: Secondary | ICD-10-CM | POA: Diagnosis not present

## 2012-10-25 DIAGNOSIS — M6281 Muscle weakness (generalized): Secondary | ICD-10-CM | POA: Diagnosis not present

## 2012-10-31 DIAGNOSIS — R350 Frequency of micturition: Secondary | ICD-10-CM | POA: Diagnosis not present

## 2012-10-31 DIAGNOSIS — N3941 Urge incontinence: Secondary | ICD-10-CM | POA: Diagnosis not present

## 2012-11-02 DIAGNOSIS — R279 Unspecified lack of coordination: Secondary | ICD-10-CM | POA: Diagnosis not present

## 2012-11-02 DIAGNOSIS — M6281 Muscle weakness (generalized): Secondary | ICD-10-CM | POA: Diagnosis not present

## 2012-11-02 DIAGNOSIS — R269 Unspecified abnormalities of gait and mobility: Secondary | ICD-10-CM | POA: Diagnosis not present

## 2012-11-04 DIAGNOSIS — R269 Unspecified abnormalities of gait and mobility: Secondary | ICD-10-CM | POA: Diagnosis not present

## 2012-11-04 DIAGNOSIS — M6281 Muscle weakness (generalized): Secondary | ICD-10-CM | POA: Diagnosis not present

## 2012-11-04 DIAGNOSIS — R279 Unspecified lack of coordination: Secondary | ICD-10-CM | POA: Diagnosis not present

## 2012-11-07 DIAGNOSIS — N3941 Urge incontinence: Secondary | ICD-10-CM | POA: Diagnosis not present

## 2012-11-07 DIAGNOSIS — R351 Nocturia: Secondary | ICD-10-CM | POA: Diagnosis not present

## 2012-11-07 DIAGNOSIS — R350 Frequency of micturition: Secondary | ICD-10-CM | POA: Diagnosis not present

## 2012-11-10 DIAGNOSIS — M6281 Muscle weakness (generalized): Secondary | ICD-10-CM | POA: Diagnosis not present

## 2012-11-10 DIAGNOSIS — R269 Unspecified abnormalities of gait and mobility: Secondary | ICD-10-CM | POA: Diagnosis not present

## 2012-11-10 DIAGNOSIS — R279 Unspecified lack of coordination: Secondary | ICD-10-CM | POA: Diagnosis not present

## 2012-11-14 DIAGNOSIS — R350 Frequency of micturition: Secondary | ICD-10-CM | POA: Diagnosis not present

## 2012-11-14 DIAGNOSIS — N3941 Urge incontinence: Secondary | ICD-10-CM | POA: Diagnosis not present

## 2012-11-15 DIAGNOSIS — R269 Unspecified abnormalities of gait and mobility: Secondary | ICD-10-CM | POA: Diagnosis not present

## 2012-11-15 DIAGNOSIS — M6281 Muscle weakness (generalized): Secondary | ICD-10-CM | POA: Diagnosis not present

## 2012-11-15 DIAGNOSIS — R279 Unspecified lack of coordination: Secondary | ICD-10-CM | POA: Diagnosis not present

## 2012-11-17 DIAGNOSIS — R279 Unspecified lack of coordination: Secondary | ICD-10-CM | POA: Diagnosis not present

## 2012-11-17 DIAGNOSIS — R269 Unspecified abnormalities of gait and mobility: Secondary | ICD-10-CM | POA: Diagnosis not present

## 2012-11-17 DIAGNOSIS — M6281 Muscle weakness (generalized): Secondary | ICD-10-CM | POA: Diagnosis not present

## 2012-11-21 DIAGNOSIS — R350 Frequency of micturition: Secondary | ICD-10-CM | POA: Diagnosis not present

## 2012-11-21 DIAGNOSIS — N3941 Urge incontinence: Secondary | ICD-10-CM | POA: Diagnosis not present

## 2012-11-24 DIAGNOSIS — M6281 Muscle weakness (generalized): Secondary | ICD-10-CM | POA: Diagnosis not present

## 2012-11-24 DIAGNOSIS — R279 Unspecified lack of coordination: Secondary | ICD-10-CM | POA: Diagnosis not present

## 2012-11-24 DIAGNOSIS — R269 Unspecified abnormalities of gait and mobility: Secondary | ICD-10-CM | POA: Diagnosis not present

## 2012-11-25 DIAGNOSIS — M6281 Muscle weakness (generalized): Secondary | ICD-10-CM | POA: Diagnosis not present

## 2012-11-25 DIAGNOSIS — R279 Unspecified lack of coordination: Secondary | ICD-10-CM | POA: Diagnosis not present

## 2012-11-25 DIAGNOSIS — R269 Unspecified abnormalities of gait and mobility: Secondary | ICD-10-CM | POA: Diagnosis not present

## 2012-11-28 DIAGNOSIS — N3941 Urge incontinence: Secondary | ICD-10-CM | POA: Diagnosis not present

## 2012-11-28 DIAGNOSIS — R350 Frequency of micturition: Secondary | ICD-10-CM | POA: Diagnosis not present

## 2012-11-29 DIAGNOSIS — R269 Unspecified abnormalities of gait and mobility: Secondary | ICD-10-CM | POA: Diagnosis not present

## 2012-11-29 DIAGNOSIS — R279 Unspecified lack of coordination: Secondary | ICD-10-CM | POA: Diagnosis not present

## 2012-11-29 DIAGNOSIS — M6281 Muscle weakness (generalized): Secondary | ICD-10-CM | POA: Diagnosis not present

## 2012-12-07 DIAGNOSIS — M6281 Muscle weakness (generalized): Secondary | ICD-10-CM | POA: Diagnosis not present

## 2012-12-07 DIAGNOSIS — R269 Unspecified abnormalities of gait and mobility: Secondary | ICD-10-CM | POA: Diagnosis not present

## 2012-12-07 DIAGNOSIS — R279 Unspecified lack of coordination: Secondary | ICD-10-CM | POA: Diagnosis not present

## 2012-12-09 DIAGNOSIS — M6281 Muscle weakness (generalized): Secondary | ICD-10-CM | POA: Diagnosis not present

## 2012-12-09 DIAGNOSIS — R279 Unspecified lack of coordination: Secondary | ICD-10-CM | POA: Diagnosis not present

## 2012-12-09 DIAGNOSIS — R269 Unspecified abnormalities of gait and mobility: Secondary | ICD-10-CM | POA: Diagnosis not present

## 2012-12-12 DIAGNOSIS — N3941 Urge incontinence: Secondary | ICD-10-CM | POA: Diagnosis not present

## 2012-12-12 DIAGNOSIS — R350 Frequency of micturition: Secondary | ICD-10-CM | POA: Diagnosis not present

## 2012-12-14 DIAGNOSIS — M6281 Muscle weakness (generalized): Secondary | ICD-10-CM | POA: Diagnosis not present

## 2012-12-14 DIAGNOSIS — R269 Unspecified abnormalities of gait and mobility: Secondary | ICD-10-CM | POA: Diagnosis not present

## 2012-12-14 DIAGNOSIS — R279 Unspecified lack of coordination: Secondary | ICD-10-CM | POA: Diagnosis not present

## 2012-12-16 DIAGNOSIS — R279 Unspecified lack of coordination: Secondary | ICD-10-CM | POA: Diagnosis not present

## 2012-12-16 DIAGNOSIS — R269 Unspecified abnormalities of gait and mobility: Secondary | ICD-10-CM | POA: Diagnosis not present

## 2012-12-16 DIAGNOSIS — M6281 Muscle weakness (generalized): Secondary | ICD-10-CM | POA: Diagnosis not present

## 2012-12-19 DIAGNOSIS — N3941 Urge incontinence: Secondary | ICD-10-CM | POA: Diagnosis not present

## 2012-12-21 DIAGNOSIS — I129 Hypertensive chronic kidney disease with stage 1 through stage 4 chronic kidney disease, or unspecified chronic kidney disease: Secondary | ICD-10-CM | POA: Diagnosis not present

## 2012-12-21 DIAGNOSIS — M6281 Muscle weakness (generalized): Secondary | ICD-10-CM | POA: Diagnosis not present

## 2012-12-21 DIAGNOSIS — R269 Unspecified abnormalities of gait and mobility: Secondary | ICD-10-CM | POA: Diagnosis not present

## 2012-12-21 DIAGNOSIS — R279 Unspecified lack of coordination: Secondary | ICD-10-CM | POA: Diagnosis not present

## 2012-12-21 DIAGNOSIS — Z23 Encounter for immunization: Secondary | ICD-10-CM | POA: Diagnosis not present

## 2012-12-21 DIAGNOSIS — F329 Major depressive disorder, single episode, unspecified: Secondary | ICD-10-CM | POA: Diagnosis not present

## 2012-12-23 DIAGNOSIS — R279 Unspecified lack of coordination: Secondary | ICD-10-CM | POA: Diagnosis not present

## 2012-12-23 DIAGNOSIS — M6281 Muscle weakness (generalized): Secondary | ICD-10-CM | POA: Diagnosis not present

## 2012-12-23 DIAGNOSIS — R269 Unspecified abnormalities of gait and mobility: Secondary | ICD-10-CM | POA: Diagnosis not present

## 2012-12-26 DIAGNOSIS — N3941 Urge incontinence: Secondary | ICD-10-CM | POA: Diagnosis not present

## 2012-12-26 DIAGNOSIS — R350 Frequency of micturition: Secondary | ICD-10-CM | POA: Diagnosis not present

## 2012-12-26 DIAGNOSIS — R3915 Urgency of urination: Secondary | ICD-10-CM | POA: Diagnosis not present

## 2012-12-27 DIAGNOSIS — R279 Unspecified lack of coordination: Secondary | ICD-10-CM | POA: Diagnosis not present

## 2012-12-27 DIAGNOSIS — R269 Unspecified abnormalities of gait and mobility: Secondary | ICD-10-CM | POA: Diagnosis not present

## 2012-12-27 DIAGNOSIS — M6281 Muscle weakness (generalized): Secondary | ICD-10-CM | POA: Diagnosis not present

## 2012-12-29 DIAGNOSIS — M6281 Muscle weakness (generalized): Secondary | ICD-10-CM | POA: Diagnosis not present

## 2012-12-29 DIAGNOSIS — R269 Unspecified abnormalities of gait and mobility: Secondary | ICD-10-CM | POA: Diagnosis not present

## 2012-12-29 DIAGNOSIS — R279 Unspecified lack of coordination: Secondary | ICD-10-CM | POA: Diagnosis not present

## 2013-01-05 DIAGNOSIS — R279 Unspecified lack of coordination: Secondary | ICD-10-CM | POA: Diagnosis not present

## 2013-01-05 DIAGNOSIS — M6281 Muscle weakness (generalized): Secondary | ICD-10-CM | POA: Diagnosis not present

## 2013-01-05 DIAGNOSIS — R269 Unspecified abnormalities of gait and mobility: Secondary | ICD-10-CM | POA: Diagnosis not present

## 2013-01-06 DIAGNOSIS — R279 Unspecified lack of coordination: Secondary | ICD-10-CM | POA: Diagnosis not present

## 2013-01-06 DIAGNOSIS — R269 Unspecified abnormalities of gait and mobility: Secondary | ICD-10-CM | POA: Diagnosis not present

## 2013-01-06 DIAGNOSIS — M6281 Muscle weakness (generalized): Secondary | ICD-10-CM | POA: Diagnosis not present

## 2013-01-06 DIAGNOSIS — Z23 Encounter for immunization: Secondary | ICD-10-CM | POA: Diagnosis not present

## 2013-01-09 DIAGNOSIS — M6281 Muscle weakness (generalized): Secondary | ICD-10-CM | POA: Diagnosis not present

## 2013-01-09 DIAGNOSIS — R269 Unspecified abnormalities of gait and mobility: Secondary | ICD-10-CM | POA: Diagnosis not present

## 2013-01-09 DIAGNOSIS — R279 Unspecified lack of coordination: Secondary | ICD-10-CM | POA: Diagnosis not present

## 2013-01-11 DIAGNOSIS — R279 Unspecified lack of coordination: Secondary | ICD-10-CM | POA: Diagnosis not present

## 2013-01-11 DIAGNOSIS — R269 Unspecified abnormalities of gait and mobility: Secondary | ICD-10-CM | POA: Diagnosis not present

## 2013-01-11 DIAGNOSIS — M6281 Muscle weakness (generalized): Secondary | ICD-10-CM | POA: Diagnosis not present

## 2013-01-17 DIAGNOSIS — R269 Unspecified abnormalities of gait and mobility: Secondary | ICD-10-CM | POA: Diagnosis not present

## 2013-01-17 DIAGNOSIS — M6281 Muscle weakness (generalized): Secondary | ICD-10-CM | POA: Diagnosis not present

## 2013-01-17 DIAGNOSIS — R279 Unspecified lack of coordination: Secondary | ICD-10-CM | POA: Diagnosis not present

## 2013-01-19 ENCOUNTER — Ambulatory Visit: Payer: Medicare Other | Admitting: Diagnostic Neuroimaging

## 2013-02-06 DIAGNOSIS — R350 Frequency of micturition: Secondary | ICD-10-CM | POA: Diagnosis not present

## 2013-02-06 DIAGNOSIS — N3941 Urge incontinence: Secondary | ICD-10-CM | POA: Diagnosis not present

## 2013-02-15 DIAGNOSIS — F329 Major depressive disorder, single episode, unspecified: Secondary | ICD-10-CM | POA: Diagnosis not present

## 2013-02-15 DIAGNOSIS — R5381 Other malaise: Secondary | ICD-10-CM | POA: Diagnosis not present

## 2013-02-17 DIAGNOSIS — R269 Unspecified abnormalities of gait and mobility: Secondary | ICD-10-CM | POA: Diagnosis not present

## 2013-02-17 DIAGNOSIS — R279 Unspecified lack of coordination: Secondary | ICD-10-CM | POA: Diagnosis not present

## 2013-02-17 DIAGNOSIS — M6281 Muscle weakness (generalized): Secondary | ICD-10-CM | POA: Diagnosis not present

## 2013-03-21 DIAGNOSIS — R269 Unspecified abnormalities of gait and mobility: Secondary | ICD-10-CM | POA: Diagnosis not present

## 2013-03-21 DIAGNOSIS — R279 Unspecified lack of coordination: Secondary | ICD-10-CM | POA: Diagnosis not present

## 2013-03-21 DIAGNOSIS — M6281 Muscle weakness (generalized): Secondary | ICD-10-CM | POA: Diagnosis not present

## 2013-03-21 DIAGNOSIS — J40 Bronchitis, not specified as acute or chronic: Secondary | ICD-10-CM | POA: Diagnosis not present

## 2013-03-28 DIAGNOSIS — R269 Unspecified abnormalities of gait and mobility: Secondary | ICD-10-CM | POA: Diagnosis not present

## 2013-03-28 DIAGNOSIS — R279 Unspecified lack of coordination: Secondary | ICD-10-CM | POA: Diagnosis not present

## 2013-03-28 DIAGNOSIS — M6281 Muscle weakness (generalized): Secondary | ICD-10-CM | POA: Diagnosis not present

## 2013-04-11 ENCOUNTER — Emergency Department (HOSPITAL_COMMUNITY)
Admission: EM | Admit: 2013-04-11 | Discharge: 2013-04-11 | Disposition: A | Discharge status: Home / Self Care | Payer: Medicare Other | Attending: Emergency Medicine | Admitting: Emergency Medicine

## 2013-04-11 ENCOUNTER — Encounter (HOSPITAL_COMMUNITY): Payer: Self-pay | Admitting: Emergency Medicine

## 2013-04-11 ENCOUNTER — Emergency Department (HOSPITAL_COMMUNITY): Payer: Medicare Other

## 2013-04-11 DIAGNOSIS — I10 Essential (primary) hypertension: Secondary | ICD-10-CM | POA: Insufficient documentation

## 2013-04-11 DIAGNOSIS — IMO0002 Reserved for concepts with insufficient information to code with codable children: Secondary | ICD-10-CM | POA: Insufficient documentation

## 2013-04-11 DIAGNOSIS — R51 Headache: Secondary | ICD-10-CM | POA: Insufficient documentation

## 2013-04-11 DIAGNOSIS — F3289 Other specified depressive episodes: Secondary | ICD-10-CM | POA: Diagnosis not present

## 2013-04-11 DIAGNOSIS — M47812 Spondylosis without myelopathy or radiculopathy, cervical region: Secondary | ICD-10-CM | POA: Diagnosis not present

## 2013-04-11 DIAGNOSIS — S0990XA Unspecified injury of head, initial encounter: Secondary | ICD-10-CM | POA: Diagnosis not present

## 2013-04-11 DIAGNOSIS — Z7902 Long term (current) use of antithrombotics/antiplatelets: Secondary | ICD-10-CM | POA: Diagnosis not present

## 2013-04-11 DIAGNOSIS — Z8546 Personal history of malignant neoplasm of prostate: Secondary | ICD-10-CM | POA: Diagnosis not present

## 2013-04-11 DIAGNOSIS — M549 Dorsalgia, unspecified: Secondary | ICD-10-CM | POA: Diagnosis not present

## 2013-04-11 DIAGNOSIS — F329 Major depressive disorder, single episode, unspecified: Secondary | ICD-10-CM | POA: Diagnosis not present

## 2013-04-11 DIAGNOSIS — Z79899 Other long term (current) drug therapy: Secondary | ICD-10-CM | POA: Diagnosis not present

## 2013-04-11 DIAGNOSIS — Z8673 Personal history of transient ischemic attack (TIA), and cerebral infarction without residual deficits: Secondary | ICD-10-CM | POA: Diagnosis not present

## 2013-04-11 DIAGNOSIS — M542 Cervicalgia: Secondary | ICD-10-CM | POA: Diagnosis not present

## 2013-04-11 DIAGNOSIS — Z880 Allergy status to penicillin: Secondary | ICD-10-CM | POA: Insufficient documentation

## 2013-04-11 DIAGNOSIS — R519 Headache, unspecified: Secondary | ICD-10-CM

## 2013-04-11 DIAGNOSIS — R6889 Other general symptoms and signs: Secondary | ICD-10-CM | POA: Diagnosis not present

## 2013-04-11 DIAGNOSIS — Z8612 Personal history of poliomyelitis: Secondary | ICD-10-CM | POA: Insufficient documentation

## 2013-04-11 MED ORDER — MORPHINE SULFATE 4 MG/ML IJ SOLN
4.0000 mg | Freq: Once | INTRAMUSCULAR | Status: AC
  Administered 2013-04-11: 4 mg via INTRAVENOUS
  Filled 2013-04-11: qty 1

## 2013-04-11 NOTE — ED Notes (Signed)
Report called to [place]. Pt to be transported back to facility by [facility]. Pt moved to POD C for holding, report given to [place], Therapist, sports.

## 2013-04-11 NOTE — ED Notes (Signed)
ptr has arrived to take pt back to [facility]

## 2013-04-11 NOTE — ED Notes (Signed)
Pt transported to CT scan.

## 2013-04-11 NOTE — ED Notes (Addendum)
Pt presents to department via [facility] for evaluation of headache. Pt from [employer]. Onset 8pm last night. Pt also states neck pain and constipation. Pt is conscious alert and oriented x4. 6/10 headache and neck pain. Pain increases with movement. No neurological deficits noted. Pt is DNR.

## 2013-04-11 NOTE — ED Provider Notes (Signed)
CSN: [id number]     Arrival date & time 04/11/13  1010 History   First MD Initiated Contact with Patient 04/11/13 1011     Chief Complaint  Patient presents with  . Headache     (Consider location/radiation/quality/duration/timing/severity/associated sxs/prior Treatment) HPI Pt presenting with c/o headache as well as posterior neck pain. Pt states he began to have neck pain yesterday, the pain was sudden in onset and worse with turning his head side to side. Today he now has forontal headadache as well.  Pain is constant and throbbing.  He states neck pain is improved today however.  No fever/chills.  No vomiting.  No sore throat or pain with eating or drinking.  He states he has not had similar symptoms to this in the past.  There are no other associated systemic symptoms, there are no other alleviating or modifying factors.   Past Medical History  Diagnosis Date  . Hypertension   . Polio   . Depression   . Cancer   . Prostate ca   . Stroke    Past Surgical History  Procedure Laterality Date  . Knee surgery     Family History  Problem Relation Age of Onset  . Heart disease Mother   . Heart disease Father    History  Substance Use Topics  . Smoking status: Never Smoker   . Smokeless tobacco: Never Used  . Alcohol Use: Yes     Comment: social    Review of Systems ROS reviewed and all otherwise negative except for mentioned in HPI    Allergies  Penicillins and Sulfa antibiotics  Home Medications   Current Outpatient Rx  Name  Route  Sig  Dispense  Refill  . acetaminophen (TYLENOL) 500 MG tablet   Oral   Take 500 mg by mouth every 6 (six) hours as needed for mild pain.         Marland Kitchen amLODipine (NORVASC) 5 MG tablet   Oral   Take 5 mg by mouth daily.          . carbidopa-levodopa (SINEMET IR) 25-100 MG per tablet   Oral   Take 1 tablet by mouth 3 (three) times daily. At 8am, 2pm, and 8pm         . Cholecalciferol (VITAMIN D) 2000 UNITS CAPS   Oral   Take  2,000 Units by mouth daily.         . citalopram (CELEXA) 20 MG tablet   Oral   Take 20 mg by mouth daily.         . clopidogrel (PLAVIX) 75 MG tablet   Oral   Take 75 mg by mouth daily.          Marland Kitchen lisinopril (PRINIVIL,ZESTRIL) 10 MG tablet   Oral   Take 10 mg by mouth daily.         Marland Kitchen LORazepam (ATIVAN) 0.5 MG tablet   Oral   Take 0.5 mg by mouth at bedtime as needed for sleep.         Marland Kitchen solifenacin (VESICARE) 5 MG tablet   Oral   Take 5 mg by mouth daily.         Marland Kitchen triamcinolone cream (KENALOG) 0.1 %   Topical   Apply 1 application topically 2 (two) times daily.          BP 129/58  Pulse 83  Temp(Src) 98.2 F (36.8 C) (Oral)  Resp 18  Wt 163 lb (73.936 kg)  SpO2 94%  Vitals reviewed Physical Exam Physical Examination: General appearance - alert, well appearing, and in no distress Mental status - alert, oriented to person, place, and time Eyes - no conjunctival injection, no scleral icterus Mouth - mucous membranes moist, pharynx normal without lesions Neck- no midline tenderness to palpation, mild pain with turning head side to side Chest - clear to auscultation, no wheezes, rales or rhonchi, symmetric air entry Heart - normal rate, regular rhythm, normal S1, S2, no murmurs, rubs, clicks or gallops Abdomen - soft, nontender, nondistended, no masses or organomegaly Neurology- strength 5/5 in extremities, sensation intact, awake and alert x 3, cranial nerves 2-12 tested and intact Extremities - peripheral pulses normal, no pedal edema, no clubbing or cyanosis Skin - normal coloration and turgor, no rashes  ED Course  Procedures (including critical care time) Labs Review Labs Reviewed - No data to display Imaging Review No results found.   EKG Interpretation  Date/Time:    Ventricular Rate:    PR Interval:    QRS Duration:   QT Interval:    QTC Calculation:   R Axis:     Text Interpretation:         MDM   Final diagnoses:  Headache   Neck pain    Pt presenting with c/o neck pain and headache.  Ct shows cervical spondylosis and ossification of posterior longitudinal ligament, no acute findings.  Pt felt improved on recheck.  Normal neuro exam.  Discharged with strict return precautions.  Pt agreeable with plan.    Threasa Beards, MD 04/14/13 [phone]

## 2013-04-11 NOTE — ED Notes (Signed)
Pt eating snack at the time. No signs of distress noted. Vital signs stable.

## 2013-04-11 NOTE — Discharge Instructions (Signed)
Return to the ED with any concerns including weakness of arms or legs, changes in vision or speech, fever, decreased level of alertness/lethargy, or any other alarming symptoms  The head CT and cervical spine CT showed some chronic changes, arthritis in the neck area.  You should arrange for a recheck with your primary care doctor.

## 2013-04-11 NOTE — ED Notes (Signed)
Smith Robert [phone] (daughter)

## 2013-04-11 NOTE — ED Notes (Signed)
Notified [facility] for transportation back home 

## 2013-04-12 ENCOUNTER — Telehealth: Payer: Self-pay | Admitting: Diagnostic Neuroimaging

## 2013-04-12 NOTE — Telephone Encounter (Signed)
Ricardo [name] w/[name] needs Dr. Gladstone Lighter nurse to call her concerning (daughter wants pt to come in asap thinks its a nerve issue). Please contact Ricardo [name] at [phone] concerning this issue.

## 2013-04-14 NOTE — Telephone Encounter (Signed)
I called and made RV appt for pt with PD.  Also was seen in ED recently for headaches.   Spoke with daugher, Smith Robert

## 2013-04-18 ENCOUNTER — Ambulatory Visit (INDEPENDENT_AMBULATORY_CARE_PROVIDER_SITE_OTHER): Payer: Medicare Other | Admitting: Diagnostic Neuroimaging

## 2013-04-18 ENCOUNTER — Encounter: Payer: Self-pay | Admitting: Diagnostic Neuroimaging

## 2013-04-18 VITALS — BP 139/76 | HR 75

## 2013-04-18 DIAGNOSIS — G20 Parkinson's disease: Secondary | ICD-10-CM

## 2013-04-18 DIAGNOSIS — R269 Unspecified abnormalities of gait and mobility: Secondary | ICD-10-CM | POA: Diagnosis not present

## 2013-04-18 NOTE — Progress Notes (Signed)
[facility]  PATIENT: Ricardo [name] DOB: 05/25/1921  REFERRING CLINICIAN:  HISTORY FROM: patient and daughter REASON FOR VISIT: follow up   HISTORICAL  CHIEF COMPLAINT:  Chief Complaint  Patient presents with  . Follow-up    PD, HA    HISTORY OF PRESENT ILLNESS:   UPDATE 04/18/13: Since last visit, has transitioned to assisted living. Tried carb/levo, with some improvement in tremor. Had 1 fall (slid off bed). Also of note, tragically pts grandson passed away 1 month ago.  UPDATE 09/12/12: Since last visit, not much benefit with carb/levo. More falls. Still in independent living. Barely able to stand. Has freezing gait. Still showers standing up, with great fear of falling. Multiple falls, at kitchen, bathroom, outside. Mainly wheelchair bound now.  PRIOR HPI (07/29/11): 80 year old male with history of polio affecting the right leg 1953, depression, anxiety, hypertension, here for evaluation of progressive deterioration.  Patient reports history of polio affecting his right leg at age 27 or 80 years old. He had 90% recovery of function. Starting 5 years ago, patient started using a cane for balance. 2 years ago he started using a walker. Since that time has had continued progressive gait deterioration, decreased energy, malaise. He is taking increasingly short shuffling steps. His handwriting is poor. His mild intermittent tremor. He lives in independent living at [place] with his wife who has Alzheimer's disease. Patient has had multiple falls.  REVIEW OF SYSTEMS: Full 14 system review of systems performed and notable only for memory loss headache behavior problem confusion hearing loss.   ALLERGIES: Allergies  Allergen Reactions  . Penicillins   . Sulfa Antibiotics     HOME MEDICATIONS: Outpatient Prescriptions Prior to Visit  Medication Sig Dispense Refill  . acetaminophen (TYLENOL) 500 MG tablet Take 500 mg by mouth every 6 (six) hours as needed  for mild pain.      Marland Kitchen amLODipine (NORVASC) 5 MG tablet Take 5 mg by mouth daily.       . carbidopa-levodopa (SINEMET IR) 25-100 MG per tablet Take 1 tablet by mouth 3 (three) times daily. At 8am, 2pm, and 8pm      . Cholecalciferol (VITAMIN D) 2000 UNITS CAPS Take 2,000 Units by mouth daily.      . citalopram (CELEXA) 20 MG tablet Take 20 mg by mouth daily.      . clopidogrel (PLAVIX) 75 MG tablet Take 75 mg by mouth daily.       Marland Kitchen lisinopril (PRINIVIL,ZESTRIL) 10 MG tablet Take 10 mg by mouth daily.      Marland Kitchen LORazepam (ATIVAN) 0.5 MG tablet Take 0.5 mg by mouth at bedtime as needed for sleep.      Marland Kitchen solifenacin (VESICARE) 5 MG tablet Take 5 mg by mouth daily.      Marland Kitchen triamcinolone cream (KENALOG) 0.1 % Apply 1 application topically 2 (two) times daily.       No facility-administered medications prior to visit.    PAST MEDICAL HISTORY: Past Medical History  Diagnosis Date  . Hypertension   . Polio   . Depression   . Cancer   . Prostate ca   . Stroke     PAST SURGICAL HISTORY: Past Surgical History  Procedure Laterality Date  . Knee surgery      FAMILY HISTORY: Family History  Problem Relation Age of Onset  . Heart disease Mother   . Heart disease Father   . Heart disease Grandchild     SOCIAL HISTORY:  History  Social History  . Marital Status: Married    Spouse Name: Brandon Wiechman    Number of Children: 2  . Years of Education: College   Occupational History  .     Social History Main Topics  . Smoking status: Never Smoker   . Smokeless tobacco: Never Used  . Alcohol Use: Yes     Comment: social  . Drug Use: No  . Sexual Activity: Not on file   Other Topics Concern  . Not on file   Social History Narrative   Patient lives at home with his spouse.   Patient currently lives at [facility].    Caffeine Use: 3 cups of caffeine daily.     PHYSICAL EXAM  Filed Vitals:   04/18/13 1456  BP: 139/76  Pulse: 75    Not recorded    Cannot calculate BMI  with a height equal to zero.  GENERAL EXAM: General: Patient is awake, alert and in no acute distress.  Well developed and groomed. MASKED FACIES. Cardiovascular: No carotid artery bruits.  Heart is regular rate and rhythm with no murmurs.  Neurologic Exam  Mental Status: Awake, alert. Language is fluent and comprehension intact. POSITIVE MYERSONS. NEG SNOUT. Cranial Nerves: No evidence of papilledema on funduscopic exam.  Pupils are equal and reactive to light.  Visual fields are full to confrontation.  Conjugate eye movements are full and symmetric.  Facial sensation and strength are symmetric.  Hearing is intact.  Palate elevated symmetrically and uvula is midline.  Shoulder shrug is symmetric.  Tongue is midline. Motor: Normal bulk. POSTURAL TREMOR OF BUE. COGWHEELING IN BUE. MILD BRADYKINESIA IN BUE. MODERATE BRADYKINESIA IN BLE. BUE 4. BLE 3.  Sensory: Intact and symmetric to light touch. Coordination: No ataxia or dysmetria on finger-nose or rapid alternating movement testing. Gait and Station: [employer] [facility] [place] FROM [facility] WITHOUT ASSISTANCE. ABLE TO TAKE A FEW STEPS WITH 2 PERSON ASSISTANCE. Reflexes: Deep tendon reflexes in the upper and lower extremity are TRACE.   DIAGNOSTIC DATA (LABS, IMAGING, TESTING) - I reviewed patient records, labs, notes, testing and imaging myself where available.  Lab Results  Component Value Date   WBC 9.9 09/26/2010   HGB 16.8 09/26/2010   HCT 46.4 09/26/2010   MCV 92.6 09/26/2010   PLT 212 09/26/2010      Component Value Date/Time   NA 139 09/26/2010 0610   K 3.7 09/26/2010 0610   CL 103 09/26/2010 0610   CO2 25 09/26/2010 0610   GLUCOSE 98 09/26/2010 0610   BUN 19 09/26/2010 0610   CREATININE 0.83 09/26/2010 0610   CALCIUM 9.3 09/26/2010 0610   PROT 7.2 09/26/2010 0610   ALBUMIN 3.7 09/26/2010 0610   AST 20 09/26/2010 0610   ALT 7 09/26/2010 0610   ALKPHOS 61 09/26/2010 0610   BILITOT 0.9 09/26/2010 0610   GFRNONAA >60 09/26/2010 0610   GFRAA >60  09/26/2010 0610   Lab Results  Component Value Date   CHOL 172 09/17/2010   HDL 59 09/17/2010   LDLCALC 103* 09/17/2010   TRIG 52 09/17/2010   CHOLHDL 2.9 09/17/2010   Lab Results  Component Value Date   HGBA1C 5.6 09/17/2010   No results found for this basename: VITAMINB12   Lab Results  Component Value Date   TSH 2.148 09/26/2010    09/17/10 MRI brain - moderate perisylvian and temporal atrophy; mild small vessel ischemic disease   04/11/13 CT head 1. There is no evidence of an acute ischemic or hemorrhagic infarction.  2. Mild age appropriate diffuse cerebral and cerebellar atrophy is present. There are findings consistent with chronic small vessel ischemia. Small old lacunar infarctions within the basal ganglia bilaterally are present. Since the previous study hypodensity within the left thalmic nucleus has increased in conspicuity.  3. There is no evidence of acute calvarial abnormality nor of acute sinus inflammation.  04/11/13 CT cervical spine  Severe cervical spondylosis and ossification of the posterior longitudinal ligament without an acute osseous abnormality. - I reviewed CT studies. There is C3-4 moderate-severe spinal stenosis due to posterior osteophytic complex.   ASSESSMENT AND PLAN  80 y.o. male with hypertension, depression, prior stroke, here for evaluation/mgmt of progressive gait deterioration since 2011. Exam notable for frontal release signs, masked facies, cogwheeling rigidity, short shuffling steps.   Ddx: parkinson's disease + cervical spinal stenosis (C3-4)  PLAN: 1. Continue carbidopa-levodopa 2. Not a surgical candidate for c-spine disease  Return in about 6 months (around 10/19/2013).     Penni Bombard, MD 09/22/7670, 0:94 PM Certified in Neurology, Neurophysiology and Neuroimaging  [facility] [address] [place], [place] [address] [phone]

## 2013-04-24 DIAGNOSIS — Z79899 Other long term (current) drug therapy: Secondary | ICD-10-CM | POA: Diagnosis not present

## 2013-04-24 DIAGNOSIS — I10 Essential (primary) hypertension: Secondary | ICD-10-CM | POA: Diagnosis not present

## 2013-04-24 DIAGNOSIS — Z733 Stress, not elsewhere classified: Secondary | ICD-10-CM | POA: Diagnosis not present

## 2013-04-24 DIAGNOSIS — R21 Rash and other nonspecific skin eruption: Secondary | ICD-10-CM | POA: Diagnosis not present

## 2013-05-02 ENCOUNTER — Ambulatory Visit: Payer: Medicare Other | Admitting: Interventional Cardiology

## 2013-06-14 DIAGNOSIS — L738 Other specified follicular disorders: Secondary | ICD-10-CM | POA: Diagnosis not present

## 2013-06-19 ENCOUNTER — Ambulatory Visit: Payer: Medicare Other | Admitting: Interventional Cardiology

## 2013-07-06 ENCOUNTER — Ambulatory Visit: Payer: Medicare Other | Admitting: Diagnostic Neuroimaging

## 2013-08-09 ENCOUNTER — Encounter: Payer: Self-pay | Admitting: *Deleted

## 2013-10-04 DIAGNOSIS — I10 Essential (primary) hypertension: Secondary | ICD-10-CM | POA: Diagnosis not present

## 2013-10-04 DIAGNOSIS — Z79899 Other long term (current) drug therapy: Secondary | ICD-10-CM | POA: Diagnosis not present

## 2013-10-04 DIAGNOSIS — F329 Major depressive disorder, single episode, unspecified: Secondary | ICD-10-CM | POA: Diagnosis not present

## 2013-10-04 DIAGNOSIS — Z0000 Encounter for general adult medical examination without abnormal findings: Secondary | ICD-10-CM | POA: Diagnosis not present

## 2013-11-21 DIAGNOSIS — Z23 Encounter for immunization: Secondary | ICD-10-CM | POA: Diagnosis not present

## 2014-09-24 ENCOUNTER — Emergency Department (HOSPITAL_COMMUNITY)
Admission: EM | Admit: 2014-09-24 | Discharge: 2014-09-25 | Disposition: A | Discharge status: Home / Self Care | Payer: Medicare Other | Attending: Emergency Medicine | Admitting: Emergency Medicine

## 2014-09-24 ENCOUNTER — Encounter (HOSPITAL_COMMUNITY): Payer: Self-pay | Admitting: Emergency Medicine

## 2014-09-24 DIAGNOSIS — Z880 Allergy status to penicillin: Secondary | ICD-10-CM | POA: Diagnosis not present

## 2014-09-24 DIAGNOSIS — Z79899 Other long term (current) drug therapy: Secondary | ICD-10-CM | POA: Insufficient documentation

## 2014-09-24 DIAGNOSIS — W010XXA Fall on same level from slipping, tripping and stumbling without subsequent striking against object, initial encounter: Secondary | ICD-10-CM | POA: Insufficient documentation

## 2014-09-24 DIAGNOSIS — I10 Essential (primary) hypertension: Secondary | ICD-10-CM | POA: Insufficient documentation

## 2014-09-24 DIAGNOSIS — R51 Headache: Secondary | ICD-10-CM | POA: Diagnosis not present

## 2014-09-24 DIAGNOSIS — S0990XA Unspecified injury of head, initial encounter: Secondary | ICD-10-CM | POA: Diagnosis not present

## 2014-09-24 DIAGNOSIS — Z8546 Personal history of malignant neoplasm of prostate: Secondary | ICD-10-CM | POA: Insufficient documentation

## 2014-09-24 DIAGNOSIS — Z8673 Personal history of transient ischemic attack (TIA), and cerebral infarction without residual deficits: Secondary | ICD-10-CM | POA: Insufficient documentation

## 2014-09-24 DIAGNOSIS — Y9389 Activity, other specified: Secondary | ICD-10-CM | POA: Insufficient documentation

## 2014-09-24 DIAGNOSIS — Y9289 Other specified places as the place of occurrence of the external cause: Secondary | ICD-10-CM | POA: Insufficient documentation

## 2014-09-24 DIAGNOSIS — Y998 Other external cause status: Secondary | ICD-10-CM | POA: Diagnosis not present

## 2014-09-24 DIAGNOSIS — S3992XA Unspecified injury of lower back, initial encounter: Secondary | ICD-10-CM | POA: Insufficient documentation

## 2014-09-24 DIAGNOSIS — Z23 Encounter for immunization: Secondary | ICD-10-CM | POA: Diagnosis not present

## 2014-09-24 DIAGNOSIS — S0180XA Unspecified open wound of other part of head, initial encounter: Secondary | ICD-10-CM | POA: Diagnosis not present

## 2014-09-24 DIAGNOSIS — S80212A Abrasion, left knee, initial encounter: Secondary | ICD-10-CM | POA: Insufficient documentation

## 2014-09-24 DIAGNOSIS — S199XXA Unspecified injury of neck, initial encounter: Secondary | ICD-10-CM | POA: Diagnosis not present

## 2014-09-24 DIAGNOSIS — F329 Major depressive disorder, single episode, unspecified: Secondary | ICD-10-CM | POA: Insufficient documentation

## 2014-09-24 DIAGNOSIS — M549 Dorsalgia, unspecified: Secondary | ICD-10-CM | POA: Diagnosis not present

## 2014-09-24 DIAGNOSIS — S299XXA Unspecified injury of thorax, initial encounter: Secondary | ICD-10-CM | POA: Diagnosis not present

## 2014-09-24 DIAGNOSIS — Z8619 Personal history of other infectious and parasitic diseases: Secondary | ICD-10-CM | POA: Insufficient documentation

## 2014-09-24 DIAGNOSIS — S80211A Abrasion, right knee, initial encounter: Secondary | ICD-10-CM | POA: Diagnosis not present

## 2014-09-24 DIAGNOSIS — S50312A Abrasion of left elbow, initial encounter: Secondary | ICD-10-CM | POA: Diagnosis not present

## 2014-09-24 DIAGNOSIS — W19XXXA Unspecified fall, initial encounter: Secondary | ICD-10-CM

## 2014-09-24 DIAGNOSIS — M542 Cervicalgia: Secondary | ICD-10-CM | POA: Diagnosis not present

## 2014-09-24 DIAGNOSIS — Y92129 Unspecified place in nursing home as the place of occurrence of the external cause: Secondary | ICD-10-CM

## 2014-09-24 DIAGNOSIS — S0181XA Laceration without foreign body of other part of head, initial encounter: Secondary | ICD-10-CM | POA: Diagnosis not present

## 2014-09-24 DIAGNOSIS — M5489 Other dorsalgia: Secondary | ICD-10-CM | POA: Diagnosis not present

## 2014-09-24 HISTORY — DX: Unspecified abnormalities of gait and mobility: R26.9

## 2014-09-24 NOTE — ED Notes (Signed)
Pt daughter states she does not want unnecessary test done and that is her father is alert and talking she wants [place] to just stitch him up and send him home. Info relayed to the MD.

## 2014-09-24 NOTE — ED Notes (Signed)
Per [facility]witness fall, denies LOC, Ax4. Hx of parkinson's. States has abnormal gait and fell forward walking towards bathroom. Hit head on floor. Laceration approx 2 in l side of forehead. Laceration left side on temporal area. Bleeding controlled. Originally denied neck and back pain. Knee and lower back pain. KED immobilization and a rolled towel for c collar. ST Left elbow, abrasion on knees.Lungs clear and equal. 12 lead shows R bundle block unknown of onset. NSR. 138/84 98 RA 82 DNR No blood thinners. Denies N/V or blurred vision.  Uses walker per his norm.  Pt from [place] at [facility]. Does not know what year it is.  Hearing aide in R ear.

## 2014-09-25 ENCOUNTER — Emergency Department (HOSPITAL_COMMUNITY): Payer: Medicare Other

## 2014-09-25 DIAGNOSIS — S0990XA Unspecified injury of head, initial encounter: Secondary | ICD-10-CM | POA: Diagnosis not present

## 2014-09-25 DIAGNOSIS — S199XXA Unspecified injury of neck, initial encounter: Secondary | ICD-10-CM | POA: Diagnosis not present

## 2014-09-25 DIAGNOSIS — S0191XA Laceration without foreign body of unspecified part of head, initial encounter: Secondary | ICD-10-CM | POA: Diagnosis not present

## 2014-09-25 DIAGNOSIS — S0181XA Laceration without foreign body of other part of head, initial encounter: Secondary | ICD-10-CM | POA: Diagnosis not present

## 2014-09-25 DIAGNOSIS — R51 Headache: Secondary | ICD-10-CM | POA: Diagnosis not present

## 2014-09-25 DIAGNOSIS — M542 Cervicalgia: Secondary | ICD-10-CM | POA: Diagnosis not present

## 2014-09-25 DIAGNOSIS — M5489 Other dorsalgia: Secondary | ICD-10-CM | POA: Diagnosis not present

## 2014-09-25 DIAGNOSIS — S3992XA Unspecified injury of lower back, initial encounter: Secondary | ICD-10-CM | POA: Diagnosis not present

## 2014-09-25 DIAGNOSIS — M549 Dorsalgia, unspecified: Secondary | ICD-10-CM | POA: Diagnosis not present

## 2014-09-25 DIAGNOSIS — S299XXA Unspecified injury of thorax, initial encounter: Secondary | ICD-10-CM | POA: Diagnosis not present

## 2014-09-25 MED ORDER — LIDOCAINE-EPINEPHRINE 1 %-1:100000 IJ SOLN
20.0000 mL | Freq: Once | INTRAMUSCULAR | Status: AC
  Administered 2014-09-25: 20 mL
  Filled 2014-09-25: qty 1

## 2014-09-25 MED ORDER — TETANUS-DIPHTH-ACELL PERTUSSIS 5-2.5-18.5 LF-MCG/0.5 IM SUSP
0.5000 mL | Freq: Once | INTRAMUSCULAR | Status: AC
  Administered 2014-09-25: 0.5 mL via INTRAMUSCULAR
  Filled 2014-09-25: qty 0.5

## 2014-09-25 NOTE — ED Notes (Signed)
Patient transported to CT 

## 2014-09-25 NOTE — ED Provider Notes (Signed)
CSN: [id number]     Arrival date & time 09/24/14  2234 History   This chart was scribed for Delora Fuel, MD by Forrestine Him, ED Scribe. This patient was seen in room D31C/D31C and the patient's care was started 12:12 AM.   Chief Complaint  Patient presents with  . Fall  . Back Pain    after fall  . Neck Injury    after fall  . Knee Pain    after fall   The history is provided by the patient. No language interpreter was used.    HPI Comments: Ricardo [name] brought in by EMS from [facility] facility is a 81 y.o. male with a PMHx of HTN and stroke who presents to the Emergency Department here after a fall this evening. Pt states he tripped and fell landing on his face. He is unsure of the mechanism of his fall. However, he denies any LOC at time of incident. He now c/o constant, ongoing back pain and has noted an open wound to the L side of the forehead. Abrasions also noted to L elbow and knees bilaterally. Bleeding controlled at this time. No OTC medications or home remedies attempted prior to arrival. No interventions given en route to department. No recent fever, chills, shortness of breath, or chest pain. No weakness, numbness, or loss of sensation. Pt with known allergies to Penicillins and Sulfa Antibiotics.  Past Medical History  Diagnosis Date  . Hypertension   . Polio   . Depression   . Cancer   . Prostate ca   . Stroke   . Gait disorder    Past Surgical History  Procedure Laterality Date  . Knee surgery     Family History  Problem Relation Age of Onset  . Heart disease Mother   . Heart disease Father   . Heart disease Grandchild    History  Substance Use Topics  . Smoking status: Never Smoker   . Smokeless tobacco: Never Used  . Alcohol Use: Yes     Comment: social    Review of Systems  Constitutional: Negative for fever and chills.  Respiratory: Negative for cough and shortness of breath.   Cardiovascular: Negative for chest pain.  Gastrointestinal: Negative  for nausea, vomiting and abdominal pain.  Musculoskeletal: Positive for back pain.  Skin: Positive for wound.  Neurological: Negative for weakness and numbness.  All other systems reviewed and are negative.     Allergies  Penicillins and Sulfa antibiotics  Home Medications   Prior to Admission medications   Medication Sig Start Date End Date Taking? Authorizing Provider  acetaminophen (TYLENOL) 500 MG tablet Take 500 mg by mouth every 6 (six) hours as needed for mild pain.   Yes Historical Provider, MD  amLODipine (NORVASC) 5 MG tablet Take 5 mg by mouth daily.  08/01/12  Yes Historical Provider, MD  carbidopa-levodopa (SINEMET IR) 25-100 MG per tablet Take 1 tablet by mouth 3 (three) times daily. At 8am, 2pm, and 8pm 09/12/12  Yes Penni Bombard, MD  Cholecalciferol (VITAMIN D) 2000 UNITS CAPS Take 2,000 Units by mouth daily.   Yes Historical Provider, MD  citalopram (CELEXA) 20 MG tablet Take 20 mg by mouth daily.   Yes Historical Provider, MD  clopidogrel (PLAVIX) 75 MG tablet Take 75 mg by mouth daily.  08/23/12  Yes Historical Provider, MD  HYDROcodone-acetaminophen (NORCO/VICODIN) 5-325 MG per tablet Take 1 tablet by mouth every 6 (six) hours as needed for moderate pain.   Yes Historical  Provider, MD  lisinopril (PRINIVIL,ZESTRIL) 10 MG tablet Take 10 mg by mouth daily.   Yes Historical Provider, MD  LORazepam (ATIVAN) 0.5 MG tablet Take 0.5 mg by mouth at bedtime as needed for sleep.   Yes Historical Provider, MD  solifenacin (VESICARE) 5 MG tablet Take 5 mg by mouth daily.   Yes Historical Provider, MD   Triage Vitals: BP 148/64 mmHg  Pulse 81  Temp(Src) 97.7 F (36.5 C) (Oral)  Resp 20  Ht 5\' 10"  (1.778 m)  Wt 165 lb (74.844 kg)  BMI 23.68 kg/m2  SpO2 100%   Physical Exam  Constitutional: He is oriented to person, place, and time. He appears well-developed and well-nourished.  HENT:  Head: Normocephalic and atraumatic.  Laceration noted to L side of forehead   Eyes:  EOM are normal. Pupils are equal, round, and reactive to light.  Neck: No JVD present.  Immobilized in a stiff cervical collar.  Cardiovascular: Normal rate, regular rhythm, normal heart sounds and intact distal pulses.   No murmur heard. Pulmonary/Chest: Effort normal and breath sounds normal. He has no wheezes. He has no rales. He exhibits no tenderness.  Abdominal: Soft. Bowel sounds are normal. He exhibits no distension and no mass. There is no tenderness.  Musculoskeletal: Normal range of motion. He exhibits tenderness.  Back is tender diffusely  No point tenderness  1 plus pitting edema bilaterally   Lymphadenopathy:    He has no cervical adenopathy.  Neurological: He is alert and oriented to person, place, and time. No cranial nerve deficit. He exhibits normal muscle tone. Coordination normal.  Oriented to person and place. Not time. Cogwweel rigidity   Skin: Skin is warm and dry. No rash noted.  Psychiatric: He has a normal mood and affect. Judgment normal.  Nursing note and vitals reviewed.   ED Course  Procedures (including critical care time)  DIAGNOSTIC STUDIES: Oxygen Saturation is 100% on RA, Normal by my interpretation.    COORDINATION OF CARE: 12:18 AM- Will give Boostrix. Will order CT head without contrast, CT cervical spine without contrast, DG lumbar spine complete, and DG thoracic spine with swimmers. Will perform laceration repair.  Discussed treatment plan with pt at bedside and pt agreed to plan.     LACERATION REPAIR Performed by: Delora Fuel MD Consent: Verbal consent obtained. Risks and benefits: risks, benefits and alternatives were discussed Patient identity confirmed: provided demographic data Time out performed prior to procedure Prepped and Draped in normal sterile fashion Wound explored Laceration Location: L side of forehead Laceration Length: 5 cm No Foreign Bodies seen or palpated Anesthesia: local infiltration Local anesthetic: lidocaine  2% with epinephrine Anesthetic total: 4 ml Amount of cleaning: standard Skin closure: 5-0 Nylon Number of sutures: 8 Technique: Simple Interrupted  Patient tolerance: Patient tolerated the procedure well with no immediate complications.   Imaging Review Dg Thoracic Spine W/swimmers  09/25/2014   CLINICAL DATA:  Status post fall. Upper back pain. Initial encounter.  EXAM: THORACIC SPINE - 3 VIEWS  COMPARISON:  Chest radiograph performed 09/16/2010  FINDINGS: There is no evidence of fracture or subluxation. Vertebral bodies demonstrate normal height and alignment. Intervertebral disc spaces are preserved. The patient is status post thoracolumbar spinal fusion extending inferiorly from T10. Changes of vertebroplasty are seen at T9.  The visualized portions of both lungs are clear. The mediastinum is unremarkable in appearance.  IMPRESSION: No evidence of fracture or subluxation along the thoracic spine. Postoperative changes along the lower thoracic and lumbar spine.  Electronically Signed   By: Garald Balding M.D.   On: 09/25/2014 01:46   Dg Lumbar Spine Complete  09/25/2014   CLINICAL DATA:  Status post fall, with generalized back pain. Initial encounter.  EXAM: LUMBAR SPINE - COMPLETE 4+ VIEW  COMPARISON:  None.  FINDINGS: There is no evidence of fracture or subluxation. Vertebral bodies demonstrate normal height and alignment. Vacuum phenomenon and endplate sclerotic change are seen at L5-S1. Lumbar spinal fusion extends from T10-L4.  The visualized bowel gas pattern is unremarkable in appearance; air and stool are noted within the colon. The sacroiliac joints are within normal limits. Scattered clips are seen within the pelvis.  IMPRESSION: 1. No evidence of fracture or subluxation along the lumbar spine. 2. Postoperative change along the lower thoracic and lumbar spine, and degenerative change at L5-S1.   Electronically Signed   By: Garald Balding M.D.   On: 09/25/2014 01:56   Ct Head Wo  Contrast  09/25/2014   CLINICAL DATA:  Pain following fall  EXAM: CT HEAD WITHOUT CONTRAST  CT CERVICAL SPINE WITHOUT CONTRAST  TECHNIQUE: Multidetector CT imaging of the head and cervical spine was performed following the standard protocol without intravenous contrast. Multiplanar CT image reconstructions of the cervical spine were also generated.  COMPARISON:  CT head and CT cervical spine April 11, 2013  FINDINGS: CT HEAD FINDINGS  There is moderate diffuse atrophy. There is no intracranial mass, hemorrhage, extra-axial fluid collection, or midline shift. There is small vessel disease throughout the centra semiovale bilaterally. There is evidence of a prior infarct in the left thalamus. There is also evidence of a prior small infarct in the left medial lentiform nucleus. No new gray-white compartment lesion is identified. No acute infarct evident. Bony calvarium appears intact. The mastoid air cells are clear. There is a left frontal scalp hematoma.  CT CERVICAL SPINE FINDINGS  There is no acute fracture. There is stable 3 mm of anterolisthesis of C7 on T1. There is no new spondylolisthesis. Prevertebral soft tissues and predental space regions are normal. There is calcification in the posterior longitudinal ligament at C3 and C4, stable. There is moderate spinal stenosis due to this posterior longitudinal ligament calcification, stable. There is severe disc space narrowing at C5-6 and C6-7. There is moderate narrowing at C3-4, C4-5, and C7-T1. There is narrowing at T1-2 with prominent Schmorl's nodes at T1 and T2. There is facet hypertrophy at multiple levels, stable. There is exit foraminal narrowing at C3-4 bilaterally, C4-5 on the left, C5-6 on the right, and C6-7 on the right due to bony hypertrophy. No frank disc extrusion. There is stable pannus at C1-2. There are foci of carotid artery calcification bilaterally.  IMPRESSION: CT head: Atrophy with small vessel disease and prior small infarcts in the  left lentiform nucleus and left thalamus. No acute infarct evident. No intracranial mass, hemorrhage, or extra-axial fluid collection. Left frontal scalp hematoma present.  CT cervical spine: No acute fracture. Stable spondylolisthesis at C7-T1. Multilevel osteoarthritic change. Spinal stenosis at C3 and C4 due to calcification of the posterior longitudinal ligament, stable. No frank disc extrusion. There is carotid artery calcification bilaterally.   Electronically Signed   By: Lowella Grip III M.D.   On: 09/25/2014 02:12   Ct Cervical Spine Wo Contrast  09/25/2014   CLINICAL DATA:  Pain following fall  EXAM: CT HEAD WITHOUT CONTRAST  CT CERVICAL SPINE WITHOUT CONTRAST  TECHNIQUE: Multidetector CT imaging of the head and cervical spine was performed following the  standard protocol without intravenous contrast. Multiplanar CT image reconstructions of the cervical spine were also generated.  COMPARISON:  CT head and CT cervical spine April 11, 2013  FINDINGS: CT HEAD FINDINGS  There is moderate diffuse atrophy. There is no intracranial mass, hemorrhage, extra-axial fluid collection, or midline shift. There is small vessel disease throughout the centra semiovale bilaterally. There is evidence of a prior infarct in the left thalamus. There is also evidence of a prior small infarct in the left medial lentiform nucleus. No new gray-white compartment lesion is identified. No acute infarct evident. Bony calvarium appears intact. The mastoid air cells are clear. There is a left frontal scalp hematoma.  CT CERVICAL SPINE FINDINGS  There is no acute fracture. There is stable 3 mm of anterolisthesis of C7 on T1. There is no new spondylolisthesis. Prevertebral soft tissues and predental space regions are normal. There is calcification in the posterior longitudinal ligament at C3 and C4, stable. There is moderate spinal stenosis due to this posterior longitudinal ligament calcification, stable. There is severe disc  space narrowing at C5-6 and C6-7. There is moderate narrowing at C3-4, C4-5, and C7-T1. There is narrowing at T1-2 with prominent Schmorl's nodes at T1 and T2. There is facet hypertrophy at multiple levels, stable. There is exit foraminal narrowing at C3-4 bilaterally, C4-5 on the left, C5-6 on the right, and C6-7 on the right due to bony hypertrophy. No frank disc extrusion. There is stable pannus at C1-2. There are foci of carotid artery calcification bilaterally.  IMPRESSION: CT head: Atrophy with small vessel disease and prior small infarcts in the left lentiform nucleus and left thalamus. No acute infarct evident. No intracranial mass, hemorrhage, or extra-axial fluid collection. Left frontal scalp hematoma present.  CT cervical spine: No acute fracture. Stable spondylolisthesis at C7-T1. Multilevel osteoarthritic change. Spinal stenosis at C3 and C4 due to calcification of the posterior longitudinal ligament, stable. No frank disc extrusion. There is carotid artery calcification bilaterally.   Electronically Signed   By: Lowella Grip III M.D.   On: 09/25/2014 02:12    MDM   Final diagnoses:  Fall at nursing home, initial encounter  Forehead laceration, initial encounter    Fall with laceration of forehead. He was sent for CT of head and cervical spine showing no acute injury. Laceration is closed and he is discharged to return to his nursing care facility. Suture removal in 5 days.  I personally performed the services described in this documentation, which was scribed in my presence. The recorded information has been reviewed and is accurate.     Delora Fuel, MD 32/67/12 [id number]

## 2014-09-25 NOTE — Discharge Instructions (Signed)
Sutures need to be removed in five days.  Fall Prevention and Home Safety Falls cause injuries and can affect all age groups. It is possible to use preventive measures to significantly decrease the likelihood of falls. There are many simple measures which can make your home safer and prevent falls. OUTDOORS  Repair cracks and edges of walkways and driveways.  Remove high doorway thresholds.  Trim shrubbery on the main path into your home.  Have good outside lighting.  Clear walkways of tools, rocks, debris, and clutter.  Check that handrails are not broken and are securely fastened. Both sides of steps should have handrails.  Have leaves, snow, and ice cleared regularly.  Use sand or salt on walkways during winter months.  In the garage, clean up grease or oil spills. BATHROOM  Install night lights.  Install grab bars by the toilet and in the tub and shower.  Use non-skid mats or decals in the tub or shower.  Place a plastic non-slip stool in the shower to sit on, if needed.  Keep floors dry and clean up all water on the floor immediately.  Remove soap buildup in the tub or shower on a regular basis.  Secure bath mats with non-slip, double-sided rug tape.  Remove throw rugs and tripping hazards from the floors. BEDROOMS  Install night lights.  Make sure a bedside light is easy to reach.  Do not use oversized bedding.  Keep a telephone by your bedside.  Have a firm chair with side arms to use for getting dressed.  Remove throw rugs and tripping hazards from the floor. KITCHEN  Keep handles on pots and pans turned toward the center of the stove. Use back burners when possible.  Clean up spills quickly and allow time for drying.  Avoid walking on wet floors.  Avoid hot utensils and knives.  Position shelves so they are not too high or low.  Place commonly used objects within easy reach.  If necessary, use a sturdy step stool with a grab bar when  reaching.  Keep electrical cables out of the way.  Do not use floor polish or wax that makes floors slippery. If you must use wax, use non-skid floor wax.  Remove throw rugs and tripping hazards from the floor. STAIRWAYS  Never leave objects on stairs.  Place handrails on both sides of stairways and use them. Fix any loose handrails. Make sure handrails on both sides of the stairways are as long as the stairs.  Check carpeting to make sure it is firmly attached along stairs. Make repairs to worn or loose carpet promptly.  Avoid placing throw rugs at the top or bottom of stairways, or properly secure the rug with carpet tape to prevent slippage. Get rid of throw rugs, if possible.  Have an electrician put in a light switch at the top and bottom of the stairs. OTHER FALL PREVENTION TIPS  Wear low-heel or rubber-soled shoes that are supportive and fit well. Wear closed toe shoes.  When using a stepladder, make sure it is fully opened and both spreaders are firmly locked. Do not climb a closed stepladder.  Add color or contrast paint or tape to grab bars and handrails in your home. Place contrasting color strips on first and last steps.  Learn and use mobility aids as needed. Install an electrical emergency response system.  Turn on lights to avoid dark areas. Replace light bulbs that burn out immediately. Get light switches that glow.  Arrange furniture to  create clear pathways. Keep furniture in the same place.  Firmly attach carpet with non-skid or double-sided tape.  Eliminate uneven floor surfaces.  Select a carpet pattern that does not visually hide the edge of steps.  Be aware of all pets. OTHER HOME SAFETY TIPS  Set the water temperature for 120 F (48.8 C).  Keep emergency numbers on or near the telephone.  Keep smoke detectors on every level of the home and near sleeping areas. Document Released: 01/23/2002 Document Revised: 08/04/2011 Document Reviewed:  04/24/2011 [facility] Patient Information 2015 [place], [place]. This information is not intended to replace advice given to you by your health care provider. Make sure you discuss any questions you have with your health care provider.   Facial Laceration  A facial laceration is a cut on the face. These injuries can be painful and cause bleeding. Lacerations usually heal quickly, but they need special care to reduce scarring. DIAGNOSIS  Your health care provider will take a medical history, ask for details about how the injury occurred, and examine the wound to determine how deep the cut is. TREATMENT  Some facial lacerations may not require closure. Others may not be able to be closed because of an increased risk of infection. The risk of infection and the chance for successful closure will depend on various factors, including the amount of time since the injury occurred. The wound may be cleaned to help prevent infection. If closure is appropriate, pain medicines may be given if needed. Your health care provider will use stitches (sutures), wound glue (adhesive), or skin adhesive strips to repair the laceration. These tools bring the skin edges together to allow for faster healing and a better cosmetic outcome. If needed, you may also be given a tetanus shot. HOME CARE INSTRUCTIONS  Only take over-the-counter or prescription medicines as directed by your health care provider.  Follow your health care provider's instructions for wound care. These instructions will vary depending on the technique used for closing the wound. For Sutures:  Keep the wound clean and dry.   If you were given a bandage (dressing), you should change it at least once a day. Also change the dressing if it becomes wet or dirty, or as directed by your health care provider.   Wash the wound with soap and water 2 times a day. Rinse the wound off with water to remove all soap. Pat the wound dry with a clean towel.   After  cleaning, apply a thin layer of the antibiotic ointment recommended by your health care provider. This will help prevent infection and keep the dressing from sticking.   You may shower as usual after the first 24 hours. Do not soak the wound in water until the sutures are removed.   Get your sutures removed as directed by your health care provider. With facial lacerations, sutures should usually be taken out after 4-5 days to avoid stitch marks.   Wait a few days after your sutures are removed before applying any makeup. For Skin Adhesive Strips:  Keep the wound clean and dry.   Do not get the skin adhesive strips wet. You may bathe carefully, using caution to keep the wound dry.   If the wound gets wet, pat it dry with a clean towel.   Skin adhesive strips will fall off on their own. You may trim the strips as the wound heals. Do not remove skin adhesive strips that are still stuck to the wound. They will fall off  in time.  For Wound Adhesive:  You may briefly wet your wound in the shower or bath. Do not soak or scrub the wound. Do not swim. Avoid periods of heavy sweating until the skin adhesive has fallen off on its own. After showering or bathing, gently pat the wound dry with a clean towel.   Do not apply liquid medicine, cream medicine, ointment medicine, or makeup to your wound while the skin adhesive is in place. This may loosen the film before your wound is healed.   If a dressing is placed over the wound, be careful not to apply tape directly over the skin adhesive. This may cause the adhesive to be pulled off before the wound is healed.   Avoid prolonged exposure to sunlight or tanning lamps while the skin adhesive is in place.  The skin adhesive will usually remain in place for 5-10 days, then naturally fall off the skin. Do not pick at the adhesive film.  After Healing: Once the wound has healed, cover the wound with sunscreen during the day for 1 full year. This  can help minimize scarring. Exposure to ultraviolet light in the first year will darken the scar. It can take 1-2 years for the scar to lose its redness and to heal completely.  SEEK IMMEDIATE MEDICAL CARE IF:  You have redness, pain, or swelling around the wound.   You see ayellowish-white fluid (pus) coming from the wound.   You have chills or a fever.  MAKE SURE YOU:  Understand these instructions.  Will watch your condition.  Will get help right away if you are not doing well or get worse. Document Released: 03/12/2004 Document Revised: 11/23/2012 Document Reviewed: 09/15/2012 [facility] Patient Information 2015 [place], [place]. This information is not intended to replace advice given to you by your health care provider. Make sure you discuss any questions you have with your health care provider.

## 2014-10-03 DIAGNOSIS — R600 Localized edema: Secondary | ICD-10-CM | POA: Diagnosis not present

## 2014-10-03 DIAGNOSIS — Z4802 Encounter for removal of sutures: Secondary | ICD-10-CM | POA: Diagnosis not present

## 2014-10-04 DIAGNOSIS — S0181XD Laceration without foreign body of other part of head, subsequent encounter: Secondary | ICD-10-CM | POA: Diagnosis not present

## 2014-10-04 DIAGNOSIS — F329 Major depressive disorder, single episode, unspecified: Secondary | ICD-10-CM | POA: Diagnosis not present

## 2014-10-04 DIAGNOSIS — W19XXXD Unspecified fall, subsequent encounter: Secondary | ICD-10-CM | POA: Diagnosis not present

## 2014-10-04 DIAGNOSIS — S51012D Laceration without foreign body of left elbow, subsequent encounter: Secondary | ICD-10-CM | POA: Diagnosis not present

## 2014-10-04 DIAGNOSIS — I10 Essential (primary) hypertension: Secondary | ICD-10-CM | POA: Diagnosis not present

## 2014-10-04 DIAGNOSIS — G20 Parkinson's disease: Secondary | ICD-10-CM | POA: Diagnosis not present

## 2014-10-05 DIAGNOSIS — S0181XD Laceration without foreign body of other part of head, subsequent encounter: Secondary | ICD-10-CM | POA: Diagnosis not present

## 2014-10-05 DIAGNOSIS — I10 Essential (primary) hypertension: Secondary | ICD-10-CM | POA: Diagnosis not present

## 2014-10-05 DIAGNOSIS — S51012D Laceration without foreign body of left elbow, subsequent encounter: Secondary | ICD-10-CM | POA: Diagnosis not present

## 2014-10-05 DIAGNOSIS — G20 Parkinson's disease: Secondary | ICD-10-CM | POA: Diagnosis not present

## 2014-10-05 DIAGNOSIS — F329 Major depressive disorder, single episode, unspecified: Secondary | ICD-10-CM | POA: Diagnosis not present

## 2014-10-05 DIAGNOSIS — W19XXXD Unspecified fall, subsequent encounter: Secondary | ICD-10-CM | POA: Diagnosis not present

## 2014-10-09 DIAGNOSIS — W19XXXD Unspecified fall, subsequent encounter: Secondary | ICD-10-CM | POA: Diagnosis not present

## 2014-10-09 DIAGNOSIS — I10 Essential (primary) hypertension: Secondary | ICD-10-CM | POA: Diagnosis not present

## 2014-10-09 DIAGNOSIS — S51012D Laceration without foreign body of left elbow, subsequent encounter: Secondary | ICD-10-CM | POA: Diagnosis not present

## 2014-10-09 DIAGNOSIS — F329 Major depressive disorder, single episode, unspecified: Secondary | ICD-10-CM | POA: Diagnosis not present

## 2014-10-09 DIAGNOSIS — G20 Parkinson's disease: Secondary | ICD-10-CM | POA: Diagnosis not present

## 2014-10-09 DIAGNOSIS — S0181XD Laceration without foreign body of other part of head, subsequent encounter: Secondary | ICD-10-CM | POA: Diagnosis not present

## 2014-10-10 DIAGNOSIS — G20 Parkinson's disease: Secondary | ICD-10-CM | POA: Diagnosis not present

## 2014-10-10 DIAGNOSIS — F329 Major depressive disorder, single episode, unspecified: Secondary | ICD-10-CM | POA: Diagnosis not present

## 2014-10-10 DIAGNOSIS — W19XXXD Unspecified fall, subsequent encounter: Secondary | ICD-10-CM | POA: Diagnosis not present

## 2014-10-10 DIAGNOSIS — S0181XD Laceration without foreign body of other part of head, subsequent encounter: Secondary | ICD-10-CM | POA: Diagnosis not present

## 2014-10-10 DIAGNOSIS — I10 Essential (primary) hypertension: Secondary | ICD-10-CM | POA: Diagnosis not present

## 2014-10-10 DIAGNOSIS — S51012D Laceration without foreign body of left elbow, subsequent encounter: Secondary | ICD-10-CM | POA: Diagnosis not present

## 2014-10-11 DIAGNOSIS — I10 Essential (primary) hypertension: Secondary | ICD-10-CM | POA: Diagnosis not present

## 2014-10-11 DIAGNOSIS — W19XXXD Unspecified fall, subsequent encounter: Secondary | ICD-10-CM | POA: Diagnosis not present

## 2014-10-11 DIAGNOSIS — S51012D Laceration without foreign body of left elbow, subsequent encounter: Secondary | ICD-10-CM | POA: Diagnosis not present

## 2014-10-11 DIAGNOSIS — S0181XD Laceration without foreign body of other part of head, subsequent encounter: Secondary | ICD-10-CM | POA: Diagnosis not present

## 2014-10-11 DIAGNOSIS — F329 Major depressive disorder, single episode, unspecified: Secondary | ICD-10-CM | POA: Diagnosis not present

## 2014-10-11 DIAGNOSIS — G20 Parkinson's disease: Secondary | ICD-10-CM | POA: Diagnosis not present

## 2014-10-12 DIAGNOSIS — S0181XD Laceration without foreign body of other part of head, subsequent encounter: Secondary | ICD-10-CM | POA: Diagnosis not present

## 2014-10-12 DIAGNOSIS — F329 Major depressive disorder, single episode, unspecified: Secondary | ICD-10-CM | POA: Diagnosis not present

## 2014-10-12 DIAGNOSIS — S51012D Laceration without foreign body of left elbow, subsequent encounter: Secondary | ICD-10-CM | POA: Diagnosis not present

## 2014-10-12 DIAGNOSIS — G20 Parkinson's disease: Secondary | ICD-10-CM | POA: Diagnosis not present

## 2014-10-12 DIAGNOSIS — I10 Essential (primary) hypertension: Secondary | ICD-10-CM | POA: Diagnosis not present

## 2014-10-12 DIAGNOSIS — W19XXXD Unspecified fall, subsequent encounter: Secondary | ICD-10-CM | POA: Diagnosis not present

## 2014-10-16 DIAGNOSIS — I10 Essential (primary) hypertension: Secondary | ICD-10-CM | POA: Diagnosis not present

## 2014-10-16 DIAGNOSIS — G20 Parkinson's disease: Secondary | ICD-10-CM | POA: Diagnosis not present

## 2014-10-16 DIAGNOSIS — W19XXXD Unspecified fall, subsequent encounter: Secondary | ICD-10-CM | POA: Diagnosis not present

## 2014-10-16 DIAGNOSIS — F329 Major depressive disorder, single episode, unspecified: Secondary | ICD-10-CM | POA: Diagnosis not present

## 2014-10-16 DIAGNOSIS — S51012D Laceration without foreign body of left elbow, subsequent encounter: Secondary | ICD-10-CM | POA: Diagnosis not present

## 2014-10-16 DIAGNOSIS — S0181XD Laceration without foreign body of other part of head, subsequent encounter: Secondary | ICD-10-CM | POA: Diagnosis not present

## 2014-10-18 DIAGNOSIS — F329 Major depressive disorder, single episode, unspecified: Secondary | ICD-10-CM | POA: Diagnosis not present

## 2014-10-18 DIAGNOSIS — W19XXXD Unspecified fall, subsequent encounter: Secondary | ICD-10-CM | POA: Diagnosis not present

## 2014-10-18 DIAGNOSIS — S51012D Laceration without foreign body of left elbow, subsequent encounter: Secondary | ICD-10-CM | POA: Diagnosis not present

## 2014-10-18 DIAGNOSIS — I10 Essential (primary) hypertension: Secondary | ICD-10-CM | POA: Diagnosis not present

## 2014-10-18 DIAGNOSIS — G20 Parkinson's disease: Secondary | ICD-10-CM | POA: Diagnosis not present

## 2014-10-18 DIAGNOSIS — S0181XD Laceration without foreign body of other part of head, subsequent encounter: Secondary | ICD-10-CM | POA: Diagnosis not present

## 2014-10-19 DIAGNOSIS — G20 Parkinson's disease: Secondary | ICD-10-CM | POA: Diagnosis not present

## 2014-10-19 DIAGNOSIS — I10 Essential (primary) hypertension: Secondary | ICD-10-CM | POA: Diagnosis not present

## 2014-10-19 DIAGNOSIS — S0181XD Laceration without foreign body of other part of head, subsequent encounter: Secondary | ICD-10-CM | POA: Diagnosis not present

## 2014-10-19 DIAGNOSIS — S51012D Laceration without foreign body of left elbow, subsequent encounter: Secondary | ICD-10-CM | POA: Diagnosis not present

## 2014-10-19 DIAGNOSIS — W19XXXD Unspecified fall, subsequent encounter: Secondary | ICD-10-CM | POA: Diagnosis not present

## 2014-10-19 DIAGNOSIS — F329 Major depressive disorder, single episode, unspecified: Secondary | ICD-10-CM | POA: Diagnosis not present

## 2014-10-23 DIAGNOSIS — I10 Essential (primary) hypertension: Secondary | ICD-10-CM | POA: Diagnosis not present

## 2014-10-23 DIAGNOSIS — G20 Parkinson's disease: Secondary | ICD-10-CM | POA: Diagnosis not present

## 2014-10-23 DIAGNOSIS — S0181XD Laceration without foreign body of other part of head, subsequent encounter: Secondary | ICD-10-CM | POA: Diagnosis not present

## 2014-10-23 DIAGNOSIS — W19XXXD Unspecified fall, subsequent encounter: Secondary | ICD-10-CM | POA: Diagnosis not present

## 2014-10-23 DIAGNOSIS — F329 Major depressive disorder, single episode, unspecified: Secondary | ICD-10-CM | POA: Diagnosis not present

## 2014-10-23 DIAGNOSIS — S51012D Laceration without foreign body of left elbow, subsequent encounter: Secondary | ICD-10-CM | POA: Diagnosis not present

## 2014-10-24 DIAGNOSIS — S51012D Laceration without foreign body of left elbow, subsequent encounter: Secondary | ICD-10-CM | POA: Diagnosis not present

## 2014-10-24 DIAGNOSIS — I10 Essential (primary) hypertension: Secondary | ICD-10-CM | POA: Diagnosis not present

## 2014-10-24 DIAGNOSIS — F329 Major depressive disorder, single episode, unspecified: Secondary | ICD-10-CM | POA: Diagnosis not present

## 2014-10-24 DIAGNOSIS — W19XXXD Unspecified fall, subsequent encounter: Secondary | ICD-10-CM | POA: Diagnosis not present

## 2014-10-24 DIAGNOSIS — S0181XD Laceration without foreign body of other part of head, subsequent encounter: Secondary | ICD-10-CM | POA: Diagnosis not present

## 2014-10-24 DIAGNOSIS — G20 Parkinson's disease: Secondary | ICD-10-CM | POA: Diagnosis not present

## 2014-10-26 DIAGNOSIS — G20 Parkinson's disease: Secondary | ICD-10-CM | POA: Diagnosis not present

## 2014-10-26 DIAGNOSIS — S51012D Laceration without foreign body of left elbow, subsequent encounter: Secondary | ICD-10-CM | POA: Diagnosis not present

## 2014-10-26 DIAGNOSIS — F329 Major depressive disorder, single episode, unspecified: Secondary | ICD-10-CM | POA: Diagnosis not present

## 2014-10-26 DIAGNOSIS — I10 Essential (primary) hypertension: Secondary | ICD-10-CM | POA: Diagnosis not present

## 2014-10-26 DIAGNOSIS — S0181XD Laceration without foreign body of other part of head, subsequent encounter: Secondary | ICD-10-CM | POA: Diagnosis not present

## 2014-10-26 DIAGNOSIS — W19XXXD Unspecified fall, subsequent encounter: Secondary | ICD-10-CM | POA: Diagnosis not present

## 2014-10-29 DIAGNOSIS — W19XXXD Unspecified fall, subsequent encounter: Secondary | ICD-10-CM | POA: Diagnosis not present

## 2014-10-29 DIAGNOSIS — S0181XD Laceration without foreign body of other part of head, subsequent encounter: Secondary | ICD-10-CM | POA: Diagnosis not present

## 2014-10-29 DIAGNOSIS — I10 Essential (primary) hypertension: Secondary | ICD-10-CM | POA: Diagnosis not present

## 2014-10-29 DIAGNOSIS — S51012D Laceration without foreign body of left elbow, subsequent encounter: Secondary | ICD-10-CM | POA: Diagnosis not present

## 2014-10-29 DIAGNOSIS — F329 Major depressive disorder, single episode, unspecified: Secondary | ICD-10-CM | POA: Diagnosis not present

## 2014-10-29 DIAGNOSIS — G20 Parkinson's disease: Secondary | ICD-10-CM | POA: Diagnosis not present

## 2014-11-01 DIAGNOSIS — S0181XD Laceration without foreign body of other part of head, subsequent encounter: Secondary | ICD-10-CM | POA: Diagnosis not present

## 2014-11-01 DIAGNOSIS — I10 Essential (primary) hypertension: Secondary | ICD-10-CM | POA: Diagnosis not present

## 2014-11-01 DIAGNOSIS — W19XXXD Unspecified fall, subsequent encounter: Secondary | ICD-10-CM | POA: Diagnosis not present

## 2014-11-01 DIAGNOSIS — G20 Parkinson's disease: Secondary | ICD-10-CM | POA: Diagnosis not present

## 2014-11-01 DIAGNOSIS — S51012D Laceration without foreign body of left elbow, subsequent encounter: Secondary | ICD-10-CM | POA: Diagnosis not present

## 2014-11-01 DIAGNOSIS — F329 Major depressive disorder, single episode, unspecified: Secondary | ICD-10-CM | POA: Diagnosis not present

## 2014-11-05 DIAGNOSIS — S0181XD Laceration without foreign body of other part of head, subsequent encounter: Secondary | ICD-10-CM | POA: Diagnosis not present

## 2014-11-05 DIAGNOSIS — I10 Essential (primary) hypertension: Secondary | ICD-10-CM | POA: Diagnosis not present

## 2014-11-05 DIAGNOSIS — I129 Hypertensive chronic kidney disease with stage 1 through stage 4 chronic kidney disease, or unspecified chronic kidney disease: Secondary | ICD-10-CM | POA: Diagnosis not present

## 2014-11-05 DIAGNOSIS — S51012D Laceration without foreign body of left elbow, subsequent encounter: Secondary | ICD-10-CM | POA: Diagnosis not present

## 2014-11-05 DIAGNOSIS — F329 Major depressive disorder, single episode, unspecified: Secondary | ICD-10-CM | POA: Diagnosis not present

## 2014-11-05 DIAGNOSIS — Z23 Encounter for immunization: Secondary | ICD-10-CM | POA: Diagnosis not present

## 2014-11-05 DIAGNOSIS — N183 Chronic kidney disease, stage 3 (moderate): Secondary | ICD-10-CM | POA: Diagnosis not present

## 2014-11-05 DIAGNOSIS — W19XXXD Unspecified fall, subsequent encounter: Secondary | ICD-10-CM | POA: Diagnosis not present

## 2014-11-05 DIAGNOSIS — R634 Abnormal weight loss: Secondary | ICD-10-CM | POA: Diagnosis not present

## 2014-11-05 DIAGNOSIS — I872 Venous insufficiency (chronic) (peripheral): Secondary | ICD-10-CM | POA: Diagnosis not present

## 2014-11-05 DIAGNOSIS — G20 Parkinson's disease: Secondary | ICD-10-CM | POA: Diagnosis not present

## 2014-11-07 DIAGNOSIS — F329 Major depressive disorder, single episode, unspecified: Secondary | ICD-10-CM | POA: Diagnosis not present

## 2014-11-07 DIAGNOSIS — G20 Parkinson's disease: Secondary | ICD-10-CM | POA: Diagnosis not present

## 2014-11-07 DIAGNOSIS — I10 Essential (primary) hypertension: Secondary | ICD-10-CM | POA: Diagnosis not present

## 2014-11-07 DIAGNOSIS — S0181XD Laceration without foreign body of other part of head, subsequent encounter: Secondary | ICD-10-CM | POA: Diagnosis not present

## 2014-11-07 DIAGNOSIS — S51012D Laceration without foreign body of left elbow, subsequent encounter: Secondary | ICD-10-CM | POA: Diagnosis not present

## 2014-11-07 DIAGNOSIS — W19XXXD Unspecified fall, subsequent encounter: Secondary | ICD-10-CM | POA: Diagnosis not present

## 2014-11-13 DIAGNOSIS — S51012D Laceration without foreign body of left elbow, subsequent encounter: Secondary | ICD-10-CM | POA: Diagnosis not present

## 2014-11-13 DIAGNOSIS — W19XXXD Unspecified fall, subsequent encounter: Secondary | ICD-10-CM | POA: Diagnosis not present

## 2014-11-13 DIAGNOSIS — I10 Essential (primary) hypertension: Secondary | ICD-10-CM | POA: Diagnosis not present

## 2014-11-13 DIAGNOSIS — S0181XD Laceration without foreign body of other part of head, subsequent encounter: Secondary | ICD-10-CM | POA: Diagnosis not present

## 2014-11-13 DIAGNOSIS — G20 Parkinson's disease: Secondary | ICD-10-CM | POA: Diagnosis not present

## 2014-11-13 DIAGNOSIS — F329 Major depressive disorder, single episode, unspecified: Secondary | ICD-10-CM | POA: Diagnosis not present

## 2014-11-16 DIAGNOSIS — W19XXXD Unspecified fall, subsequent encounter: Secondary | ICD-10-CM | POA: Diagnosis not present

## 2014-11-16 DIAGNOSIS — I10 Essential (primary) hypertension: Secondary | ICD-10-CM | POA: Diagnosis not present

## 2014-11-16 DIAGNOSIS — S51012D Laceration without foreign body of left elbow, subsequent encounter: Secondary | ICD-10-CM | POA: Diagnosis not present

## 2014-11-16 DIAGNOSIS — F329 Major depressive disorder, single episode, unspecified: Secondary | ICD-10-CM | POA: Diagnosis not present

## 2014-11-16 DIAGNOSIS — G20 Parkinson's disease: Secondary | ICD-10-CM | POA: Diagnosis not present

## 2014-11-16 DIAGNOSIS — S0181XD Laceration without foreign body of other part of head, subsequent encounter: Secondary | ICD-10-CM | POA: Diagnosis not present

## 2014-11-20 DIAGNOSIS — G20 Parkinson's disease: Secondary | ICD-10-CM | POA: Diagnosis not present

## 2014-11-20 DIAGNOSIS — I10 Essential (primary) hypertension: Secondary | ICD-10-CM | POA: Diagnosis not present

## 2014-11-20 DIAGNOSIS — S51012D Laceration without foreign body of left elbow, subsequent encounter: Secondary | ICD-10-CM | POA: Diagnosis not present

## 2014-11-20 DIAGNOSIS — S0181XD Laceration without foreign body of other part of head, subsequent encounter: Secondary | ICD-10-CM | POA: Diagnosis not present

## 2014-11-20 DIAGNOSIS — F329 Major depressive disorder, single episode, unspecified: Secondary | ICD-10-CM | POA: Diagnosis not present

## 2014-11-20 DIAGNOSIS — W19XXXD Unspecified fall, subsequent encounter: Secondary | ICD-10-CM | POA: Diagnosis not present

## 2014-11-22 DIAGNOSIS — S51012D Laceration without foreign body of left elbow, subsequent encounter: Secondary | ICD-10-CM | POA: Diagnosis not present

## 2014-11-22 DIAGNOSIS — I10 Essential (primary) hypertension: Secondary | ICD-10-CM | POA: Diagnosis not present

## 2014-11-22 DIAGNOSIS — F329 Major depressive disorder, single episode, unspecified: Secondary | ICD-10-CM | POA: Diagnosis not present

## 2014-11-22 DIAGNOSIS — S0181XD Laceration without foreign body of other part of head, subsequent encounter: Secondary | ICD-10-CM | POA: Diagnosis not present

## 2014-11-22 DIAGNOSIS — W19XXXD Unspecified fall, subsequent encounter: Secondary | ICD-10-CM | POA: Diagnosis not present

## 2014-11-22 DIAGNOSIS — G20 Parkinson's disease: Secondary | ICD-10-CM | POA: Diagnosis not present

## 2014-11-27 DIAGNOSIS — G20 Parkinson's disease: Secondary | ICD-10-CM | POA: Diagnosis not present

## 2014-11-27 DIAGNOSIS — F329 Major depressive disorder, single episode, unspecified: Secondary | ICD-10-CM | POA: Diagnosis not present

## 2014-11-27 DIAGNOSIS — W19XXXD Unspecified fall, subsequent encounter: Secondary | ICD-10-CM | POA: Diagnosis not present

## 2014-11-27 DIAGNOSIS — S0181XD Laceration without foreign body of other part of head, subsequent encounter: Secondary | ICD-10-CM | POA: Diagnosis not present

## 2014-11-27 DIAGNOSIS — I10 Essential (primary) hypertension: Secondary | ICD-10-CM | POA: Diagnosis not present

## 2014-11-27 DIAGNOSIS — S51012D Laceration without foreign body of left elbow, subsequent encounter: Secondary | ICD-10-CM | POA: Diagnosis not present

## 2014-11-29 DIAGNOSIS — I10 Essential (primary) hypertension: Secondary | ICD-10-CM | POA: Diagnosis not present

## 2014-11-29 DIAGNOSIS — S51012D Laceration without foreign body of left elbow, subsequent encounter: Secondary | ICD-10-CM | POA: Diagnosis not present

## 2014-11-29 DIAGNOSIS — G20 Parkinson's disease: Secondary | ICD-10-CM | POA: Diagnosis not present

## 2014-11-29 DIAGNOSIS — W19XXXD Unspecified fall, subsequent encounter: Secondary | ICD-10-CM | POA: Diagnosis not present

## 2014-11-29 DIAGNOSIS — F329 Major depressive disorder, single episode, unspecified: Secondary | ICD-10-CM | POA: Diagnosis not present

## 2014-11-29 DIAGNOSIS — S0181XD Laceration without foreign body of other part of head, subsequent encounter: Secondary | ICD-10-CM | POA: Diagnosis not present

## 2014-12-03 DIAGNOSIS — R278 Other lack of coordination: Secondary | ICD-10-CM | POA: Diagnosis not present

## 2014-12-03 DIAGNOSIS — R262 Difficulty in walking, not elsewhere classified: Secondary | ICD-10-CM | POA: Diagnosis not present

## 2014-12-03 DIAGNOSIS — Z9181 History of falling: Secondary | ICD-10-CM | POA: Diagnosis not present

## 2014-12-03 DIAGNOSIS — R2681 Unsteadiness on feet: Secondary | ICD-10-CM | POA: Diagnosis not present

## 2014-12-03 DIAGNOSIS — M6281 Muscle weakness (generalized): Secondary | ICD-10-CM | POA: Diagnosis not present

## 2014-12-06 DIAGNOSIS — M6281 Muscle weakness (generalized): Secondary | ICD-10-CM | POA: Diagnosis not present

## 2014-12-06 DIAGNOSIS — Z9181 History of falling: Secondary | ICD-10-CM | POA: Diagnosis not present

## 2014-12-06 DIAGNOSIS — R2681 Unsteadiness on feet: Secondary | ICD-10-CM | POA: Diagnosis not present

## 2014-12-06 DIAGNOSIS — R278 Other lack of coordination: Secondary | ICD-10-CM | POA: Diagnosis not present

## 2014-12-06 DIAGNOSIS — R262 Difficulty in walking, not elsewhere classified: Secondary | ICD-10-CM | POA: Diagnosis not present

## 2014-12-07 DIAGNOSIS — M6281 Muscle weakness (generalized): Secondary | ICD-10-CM | POA: Diagnosis not present

## 2014-12-07 DIAGNOSIS — R262 Difficulty in walking, not elsewhere classified: Secondary | ICD-10-CM | POA: Diagnosis not present

## 2014-12-07 DIAGNOSIS — R278 Other lack of coordination: Secondary | ICD-10-CM | POA: Diagnosis not present

## 2014-12-07 DIAGNOSIS — R2681 Unsteadiness on feet: Secondary | ICD-10-CM | POA: Diagnosis not present

## 2014-12-07 DIAGNOSIS — Z9181 History of falling: Secondary | ICD-10-CM | POA: Diagnosis not present

## 2014-12-11 DIAGNOSIS — R262 Difficulty in walking, not elsewhere classified: Secondary | ICD-10-CM | POA: Diagnosis not present

## 2014-12-11 DIAGNOSIS — Z9181 History of falling: Secondary | ICD-10-CM | POA: Diagnosis not present

## 2014-12-11 DIAGNOSIS — R278 Other lack of coordination: Secondary | ICD-10-CM | POA: Diagnosis not present

## 2014-12-11 DIAGNOSIS — M6281 Muscle weakness (generalized): Secondary | ICD-10-CM | POA: Diagnosis not present

## 2014-12-11 DIAGNOSIS — R2681 Unsteadiness on feet: Secondary | ICD-10-CM | POA: Diagnosis not present

## 2014-12-13 DIAGNOSIS — R278 Other lack of coordination: Secondary | ICD-10-CM | POA: Diagnosis not present

## 2014-12-13 DIAGNOSIS — M6281 Muscle weakness (generalized): Secondary | ICD-10-CM | POA: Diagnosis not present

## 2014-12-13 DIAGNOSIS — Z9181 History of falling: Secondary | ICD-10-CM | POA: Diagnosis not present

## 2014-12-13 DIAGNOSIS — R262 Difficulty in walking, not elsewhere classified: Secondary | ICD-10-CM | POA: Diagnosis not present

## 2014-12-13 DIAGNOSIS — R2681 Unsteadiness on feet: Secondary | ICD-10-CM | POA: Diagnosis not present

## 2014-12-14 DIAGNOSIS — Z9181 History of falling: Secondary | ICD-10-CM | POA: Diagnosis not present

## 2014-12-14 DIAGNOSIS — R262 Difficulty in walking, not elsewhere classified: Secondary | ICD-10-CM | POA: Diagnosis not present

## 2014-12-14 DIAGNOSIS — M6281 Muscle weakness (generalized): Secondary | ICD-10-CM | POA: Diagnosis not present

## 2014-12-14 DIAGNOSIS — R2681 Unsteadiness on feet: Secondary | ICD-10-CM | POA: Diagnosis not present

## 2014-12-14 DIAGNOSIS — R278 Other lack of coordination: Secondary | ICD-10-CM | POA: Diagnosis not present

## 2014-12-18 DIAGNOSIS — M6281 Muscle weakness (generalized): Secondary | ICD-10-CM | POA: Diagnosis not present

## 2014-12-18 DIAGNOSIS — R262 Difficulty in walking, not elsewhere classified: Secondary | ICD-10-CM | POA: Diagnosis not present

## 2014-12-18 DIAGNOSIS — R278 Other lack of coordination: Secondary | ICD-10-CM | POA: Diagnosis not present

## 2014-12-18 DIAGNOSIS — R2681 Unsteadiness on feet: Secondary | ICD-10-CM | POA: Diagnosis not present

## 2014-12-18 DIAGNOSIS — N393 Stress incontinence (female) (male): Secondary | ICD-10-CM | POA: Diagnosis not present

## 2014-12-18 DIAGNOSIS — Z9181 History of falling: Secondary | ICD-10-CM | POA: Diagnosis not present

## 2014-12-20 DIAGNOSIS — R278 Other lack of coordination: Secondary | ICD-10-CM | POA: Diagnosis not present

## 2014-12-20 DIAGNOSIS — R2681 Unsteadiness on feet: Secondary | ICD-10-CM | POA: Diagnosis not present

## 2014-12-20 DIAGNOSIS — N393 Stress incontinence (female) (male): Secondary | ICD-10-CM | POA: Diagnosis not present

## 2014-12-20 DIAGNOSIS — M6281 Muscle weakness (generalized): Secondary | ICD-10-CM | POA: Diagnosis not present

## 2014-12-20 DIAGNOSIS — R262 Difficulty in walking, not elsewhere classified: Secondary | ICD-10-CM | POA: Diagnosis not present

## 2014-12-20 DIAGNOSIS — Z9181 History of falling: Secondary | ICD-10-CM | POA: Diagnosis not present

## 2014-12-24 DIAGNOSIS — Z9181 History of falling: Secondary | ICD-10-CM | POA: Diagnosis not present

## 2014-12-24 DIAGNOSIS — R262 Difficulty in walking, not elsewhere classified: Secondary | ICD-10-CM | POA: Diagnosis not present

## 2014-12-24 DIAGNOSIS — N393 Stress incontinence (female) (male): Secondary | ICD-10-CM | POA: Diagnosis not present

## 2014-12-24 DIAGNOSIS — R2681 Unsteadiness on feet: Secondary | ICD-10-CM | POA: Diagnosis not present

## 2014-12-24 DIAGNOSIS — M6281 Muscle weakness (generalized): Secondary | ICD-10-CM | POA: Diagnosis not present

## 2014-12-24 DIAGNOSIS — R278 Other lack of coordination: Secondary | ICD-10-CM | POA: Diagnosis not present

## 2014-12-25 DIAGNOSIS — R2681 Unsteadiness on feet: Secondary | ICD-10-CM | POA: Diagnosis not present

## 2014-12-25 DIAGNOSIS — R262 Difficulty in walking, not elsewhere classified: Secondary | ICD-10-CM | POA: Diagnosis not present

## 2014-12-25 DIAGNOSIS — M6281 Muscle weakness (generalized): Secondary | ICD-10-CM | POA: Diagnosis not present

## 2014-12-25 DIAGNOSIS — Z9181 History of falling: Secondary | ICD-10-CM | POA: Diagnosis not present

## 2014-12-25 DIAGNOSIS — N393 Stress incontinence (female) (male): Secondary | ICD-10-CM | POA: Diagnosis not present

## 2014-12-25 DIAGNOSIS — R278 Other lack of coordination: Secondary | ICD-10-CM | POA: Diagnosis not present

## 2014-12-26 DIAGNOSIS — N393 Stress incontinence (female) (male): Secondary | ICD-10-CM | POA: Diagnosis not present

## 2014-12-26 DIAGNOSIS — R2681 Unsteadiness on feet: Secondary | ICD-10-CM | POA: Diagnosis not present

## 2014-12-26 DIAGNOSIS — R262 Difficulty in walking, not elsewhere classified: Secondary | ICD-10-CM | POA: Diagnosis not present

## 2014-12-26 DIAGNOSIS — Z9181 History of falling: Secondary | ICD-10-CM | POA: Diagnosis not present

## 2014-12-26 DIAGNOSIS — R278 Other lack of coordination: Secondary | ICD-10-CM | POA: Diagnosis not present

## 2014-12-26 DIAGNOSIS — M6281 Muscle weakness (generalized): Secondary | ICD-10-CM | POA: Diagnosis not present

## 2014-12-27 DIAGNOSIS — R278 Other lack of coordination: Secondary | ICD-10-CM | POA: Diagnosis not present

## 2014-12-27 DIAGNOSIS — N393 Stress incontinence (female) (male): Secondary | ICD-10-CM | POA: Diagnosis not present

## 2014-12-27 DIAGNOSIS — M6281 Muscle weakness (generalized): Secondary | ICD-10-CM | POA: Diagnosis not present

## 2014-12-27 DIAGNOSIS — Z9181 History of falling: Secondary | ICD-10-CM | POA: Diagnosis not present

## 2014-12-27 DIAGNOSIS — R262 Difficulty in walking, not elsewhere classified: Secondary | ICD-10-CM | POA: Diagnosis not present

## 2014-12-27 DIAGNOSIS — R2681 Unsteadiness on feet: Secondary | ICD-10-CM | POA: Diagnosis not present

## 2014-12-28 DIAGNOSIS — Z9181 History of falling: Secondary | ICD-10-CM | POA: Diagnosis not present

## 2014-12-28 DIAGNOSIS — R2681 Unsteadiness on feet: Secondary | ICD-10-CM | POA: Diagnosis not present

## 2014-12-28 DIAGNOSIS — R262 Difficulty in walking, not elsewhere classified: Secondary | ICD-10-CM | POA: Diagnosis not present

## 2014-12-28 DIAGNOSIS — M6281 Muscle weakness (generalized): Secondary | ICD-10-CM | POA: Diagnosis not present

## 2014-12-28 DIAGNOSIS — R278 Other lack of coordination: Secondary | ICD-10-CM | POA: Diagnosis not present

## 2014-12-28 DIAGNOSIS — N393 Stress incontinence (female) (male): Secondary | ICD-10-CM | POA: Diagnosis not present

## 2014-12-31 ENCOUNTER — Telehealth: Payer: Self-pay | Admitting: Diagnostic Neuroimaging

## 2014-12-31 DIAGNOSIS — R2681 Unsteadiness on feet: Secondary | ICD-10-CM | POA: Diagnosis not present

## 2014-12-31 DIAGNOSIS — R278 Other lack of coordination: Secondary | ICD-10-CM | POA: Diagnosis not present

## 2014-12-31 DIAGNOSIS — Z9181 History of falling: Secondary | ICD-10-CM | POA: Diagnosis not present

## 2014-12-31 DIAGNOSIS — N393 Stress incontinence (female) (male): Secondary | ICD-10-CM | POA: Diagnosis not present

## 2014-12-31 DIAGNOSIS — R262 Difficulty in walking, not elsewhere classified: Secondary | ICD-10-CM | POA: Diagnosis not present

## 2014-12-31 DIAGNOSIS — M6281 Muscle weakness (generalized): Secondary | ICD-10-CM | POA: Diagnosis not present

## 2014-12-31 NOTE — Telephone Encounter (Signed)
Daughter Smith Robert [phone] or [phone] called to request that when patient is here for appointment tomorrow, that Dr. Leta Baptist clean out and take a look at ear that has blood in it. Not sure if Dr. Leta Baptist can take care of this or if he needs to go elsewhere. Please call to advise.

## 2014-12-31 NOTE — Telephone Encounter (Signed)
Spoke with daughter and informed her that Dr Leta Baptist would most likely not clean out and exam her father's ear. He may exam from the outside but because he is not patient's PCP or ENT, he would not clean out the ear. Smith Robert stated she was told a nurse would do it. Informed her that a nurse could clean the outermost surface of his ear but would not clean inside his ear. She states this occurred last Fri at the assisted living facility where he resides. Informed her that a NP or other medical professional should be available to assist her father and examine his ear. She stated she would accompany him to his FU tomorrow with Dr Leta Baptist and inquire at the Assisted Living about his ear.  She thanked this Therapist, sports for the call back.

## 2015-01-01 ENCOUNTER — Ambulatory Visit: Payer: Medicare Other | Admitting: Diagnostic Neuroimaging

## 2015-01-01 ENCOUNTER — Encounter: Payer: Self-pay | Admitting: Diagnostic Neuroimaging

## 2015-01-01 ENCOUNTER — Ambulatory Visit (INDEPENDENT_AMBULATORY_CARE_PROVIDER_SITE_OTHER): Payer: Medicare Other | Admitting: Diagnostic Neuroimaging

## 2015-01-01 VITALS — BP 123/54 | HR 64 | Temp 97.5°F | Ht 70.0 in

## 2015-01-01 DIAGNOSIS — N393 Stress incontinence (female) (male): Secondary | ICD-10-CM | POA: Diagnosis not present

## 2015-01-01 DIAGNOSIS — H60501 Unspecified acute noninfective otitis externa, right ear: Secondary | ICD-10-CM | POA: Diagnosis not present

## 2015-01-01 DIAGNOSIS — H6123 Impacted cerumen, bilateral: Secondary | ICD-10-CM | POA: Diagnosis not present

## 2015-01-01 DIAGNOSIS — R278 Other lack of coordination: Secondary | ICD-10-CM | POA: Diagnosis not present

## 2015-01-01 DIAGNOSIS — R2681 Unsteadiness on feet: Secondary | ICD-10-CM | POA: Diagnosis not present

## 2015-01-01 DIAGNOSIS — G20 Parkinson's disease: Secondary | ICD-10-CM | POA: Diagnosis not present

## 2015-01-01 DIAGNOSIS — M6281 Muscle weakness (generalized): Secondary | ICD-10-CM | POA: Diagnosis not present

## 2015-01-01 DIAGNOSIS — R262 Difficulty in walking, not elsewhere classified: Secondary | ICD-10-CM | POA: Diagnosis not present

## 2015-01-01 DIAGNOSIS — Z9181 History of falling: Secondary | ICD-10-CM | POA: Diagnosis not present

## 2015-01-01 NOTE — Progress Notes (Signed)
[facility]  PATIENT: Ricardo [name] DOB: [birth year]  REFERRING CLINICIAN:  HISTORY FROM: patient and daughter REASON FOR VISIT: follow up   HISTORICAL  CHIEF COMPLAINT:  Chief Complaint  Patient presents with  . Follow-up    In room 7 here for f/u with daughter.    HISTORY OF PRESENT ILLNESS:   UPDATE 01/01/15: Since last visit, pts wife passed away Aug 05, 2014. Parkinson's disease sxs progressing. Last week had right ear bleeding and hearing loss. Has appt pending with PCP.   UPDATE 04/18/13: Since last visit, has transitioned to assisted living. Tried carb/levo, with some improvement in tremor. Had 1 fall (slid off bed). Also of note, tragically pts grandson passed away 1 month ago.  UPDATE 09/12/12: Since last visit, not much benefit with carb/levo. More falls. Still in independent living. Barely able to stand. Has freezing gait. Still showers standing up, with great fear of falling. Multiple falls, at kitchen, bathroom, outside. Mainly wheelchair bound now.  PRIOR HPI (07/29/11): 81 year old male with history of polio affecting the right leg 1951, depression, anxiety, hypertension, here for evaluation of progressive deterioration. Patient reports history of polio affecting his right leg at age 67 or 81 years old. He had 90% recovery of function. Starting 5 years ago, patient started using a cane for balance. 2 years ago he started using a walker. Since that time has had continued progressive gait deterioration, decreased energy, malaise. He is taking increasingly short shuffling steps. His handwriting is poor. His mild intermittent tremor. He lives in independent living at [facility] with his wife who has Alzheimer's disease. Patient has had multiple falls.   REVIEW OF SYSTEMS: Full 14 system review of systems performed and notable only for memory loss headache behavior problem confusion hearing loss.   ALLERGIES: Allergies  Allergen Reactions  . Penicillins  Other (See Comments)    unknown  . Sulfa Antibiotics Other (See Comments)    unknown    HOME MEDICATIONS: Outpatient Prescriptions Prior to Visit  Medication Sig Dispense Refill  . acetaminophen (TYLENOL) 500 MG tablet Take 500 mg by mouth every 6 (six) hours as needed for mild pain.    Marland Kitchen amLODipine (NORVASC) 5 MG tablet Take 5 mg by mouth daily.     . carbidopa-levodopa (SINEMET IR) 25-100 MG per tablet Take 1 tablet by mouth 3 (three) times daily. At 8am, 2pm, and 8pm    . Cholecalciferol (VITAMIN D) 2000 UNITS CAPS Take 2,000 Units by mouth daily.    . citalopram (CELEXA) 20 MG tablet Take 20 mg by mouth daily.    . clopidogrel (PLAVIX) 75 MG tablet Take 75 mg by mouth daily.     Marland Kitchen HYDROcodone-acetaminophen (NORCO/VICODIN) 5-325 MG per tablet Take 1 tablet by mouth every 6 (six) hours as needed for moderate pain.    Marland Kitchen lisinopril (PRINIVIL,ZESTRIL) 10 MG tablet Take 10 mg by mouth daily.    Marland Kitchen LORazepam (ATIVAN) 0.5 MG tablet Take 0.5 mg by mouth at bedtime as needed for sleep.    Marland Kitchen solifenacin (VESICARE) 5 MG tablet Take 5 mg by mouth daily.     No facility-administered medications prior to visit.    PAST MEDICAL HISTORY: Past Medical History  Diagnosis Date  . Hypertension   . Polio   . Depression   . Cancer ([place])   . Prostate CA ([place])   . Stroke ([place])   . Gait disorder     PAST SURGICAL HISTORY: Past Surgical History  Procedure Laterality Date  .  Knee surgery      FAMILY HISTORY: Family History  Problem Relation Age of Onset  . Heart disease Mother   . Heart disease Father   . Heart disease Grandchild     SOCIAL HISTORY:  Social History   Social History  . Marital Status: Married    Spouse Name: Elva Benites  . Number of Children: 2  . Years of Education: College   Occupational History  .     Social History Main Topics  . Smoking status: Never Smoker   . Smokeless tobacco: Never Used  . Alcohol Use: Yes     Comment: social  . Drug Use: No  .  Sexual Activity: Not on file   Other Topics Concern  . Not on file   Social History Narrative   Patient lives at home with his spouse.   Patient currently lives at [facility].    Caffeine Use: 3 cups of caffeine daily.     PHYSICAL EXAM  Filed Vitals:   01/01/15 1422  BP: 123/54  Pulse: 64  Temp: 97.5 F (36.4 C)  TempSrc: Oral  Height: 5\' 10"  (1.778 m)    Not recorded      There is no weight on file to calculate BMI.  GENERAL EXAM: General: Patient is awake, alert and in no acute distress.  Well developed and groomed. MASKED FACIES. DRIED BLOOD IN RIGHT EXTERNAL AUDITORY CANAL Cardiovascular: No carotid artery bruits.  Heart is regular rate and rhythm with no murmurs.  Neurologic Exam  Mental Status: Awake, alert. DECR FLUENCY; comprehension intact. POSITIVE MYERSONS. NEG SNOUT. Cranial Nerves: Pupils are equal and reactive to light.  Visual fields are full to confrontation.  Conjugate eye movements are full and symmetric.  Facial sensation and strength are symmetric.  Hearing is intact.  Palate elevated symmetrically and uvula is midline.  Shoulder shrug is symmetric.  Tongue is midline. Motor: Normal bulk. POSTURAL TREMOR OF BUE. COGWHEELING IN BUE. MILD BRADYKINESIA IN BUE. MODERATE BRADYKINESIA IN BLE. BUE 4. BLE 3.  Sensory: Intact and symmetric to light touch. Coordination: No ataxia or dysmetria on finger-nose or rapid alternating movement testing. Gait and Station: [place].  Reflexes: Deep tendon reflexes in the upper and lower extremity are TRACE.   DIAGNOSTIC DATA (LABS, IMAGING, TESTING) - I reviewed patient records, labs, notes, testing and imaging myself where available.  Lab Results  Component Value Date   WBC 9.9 09/26/2010   HGB 16.8 09/26/2010   HCT 46.4 09/26/2010   MCV 92.6 09/26/2010   PLT 212 09/26/2010      Component Value Date/Time   NA 139 09/26/2010 0610   K 3.7 09/26/2010 0610   CL 103 09/26/2010 0610   CO2 25  09/26/2010 0610   GLUCOSE 98 09/26/2010 0610   BUN 19 09/26/2010 0610   CREATININE 0.83 09/26/2010 0610   CALCIUM 9.3 09/26/2010 0610   PROT 7.2 09/26/2010 0610   ALBUMIN 3.7 09/26/2010 0610   AST 20 09/26/2010 0610   ALT 7 09/26/2010 0610   ALKPHOS 61 09/26/2010 0610   BILITOT 0.9 09/26/2010 0610   GFRNONAA >60 09/26/2010 0610   GFRAA >60 09/26/2010 0610   Lab Results  Component Value Date   CHOL 172 09/17/2010   HDL 59 09/17/2010   LDLCALC 103* 09/17/2010   TRIG 52 09/17/2010   CHOLHDL 2.9 09/17/2010   Lab Results  Component Value Date   HGBA1C 5.6 09/17/2010   No results found for: [id number] Lab Results  Component Value Date   TSH 2.148 09/26/2010    09/17/10 MRI brain - moderate perisylvian and temporal atrophy; mild small vessel ischemic disease   04/11/13 CT head 1. There is no evidence of an acute ischemic or hemorrhagic infarction.  2. Mild age appropriate diffuse cerebral and cerebellar atrophy is present. There are findings consistent with chronic small vessel ischemia. Small old lacunar infarctions within the basal ganglia bilaterally are present. Since the previous study hypodensity within the left thalmic nucleus has increased in conspicuity.  3. There is no evidence of acute calvarial abnormality nor of acute sinus inflammation.  04/11/13 CT cervical spine  Severe cervical spondylosis and ossification of the posterior longitudinal ligament without an acute osseous abnormality. - I reviewed CT studies. There is C3-4 moderate-severe spinal stenosis due to posterior osteophytic complex.   ASSESSMENT AND PLAN  81 y.o. male with hypertension, depression, prior stroke, here for evaluation/mgmt of progressive gait deterioration since 2011. Exam notable for frontal release signs, masked facies, cogwheeling rigidity, short shuffling steps.   Dx: advanced parkinson's disease + cervical spinal stenosis (C3-4)   PLAN: 1. Continue carbidopa-levodopa 2. Not a  surgical candidate for c-spine disease 3. Will refer for palliative care consult to help with advanced care planning and goals of care  Return if symptoms worsen or fail to improve.     Penni Bombard, MD [id number], [phone] PM Certified in Neurology, Neurophysiology and Neuroimaging  [facility] [address], [place], [place] [address] [phone]

## 2015-01-01 NOTE — Patient Instructions (Signed)
Thank you for coming to see [place] at [facility]. I hope we have been able to provide you high quality care today.  You may receive a patient satisfaction survey over the next few weeks. We would appreciate your feedback and comments so that we may continue to improve ourselves and the health of our patients.  - monitor symptoms    PLANNING Prepare estate planning, living will, healthcare POA documents. Sometimes this is best planned with the help of an attorney. Theconversationproject.org and agingwithdignity.org are excellent resources.

## 2015-01-03 DIAGNOSIS — R2681 Unsteadiness on feet: Secondary | ICD-10-CM | POA: Diagnosis not present

## 2015-01-03 DIAGNOSIS — R278 Other lack of coordination: Secondary | ICD-10-CM | POA: Diagnosis not present

## 2015-01-03 DIAGNOSIS — N393 Stress incontinence (female) (male): Secondary | ICD-10-CM | POA: Diagnosis not present

## 2015-01-03 DIAGNOSIS — M6281 Muscle weakness (generalized): Secondary | ICD-10-CM | POA: Diagnosis not present

## 2015-01-03 DIAGNOSIS — R262 Difficulty in walking, not elsewhere classified: Secondary | ICD-10-CM | POA: Diagnosis not present

## 2015-01-03 DIAGNOSIS — Z9181 History of falling: Secondary | ICD-10-CM | POA: Diagnosis not present

## 2015-01-04 ENCOUNTER — Encounter (HOSPITAL_COMMUNITY): Payer: Self-pay | Admitting: Emergency Medicine

## 2015-01-04 ENCOUNTER — Emergency Department (HOSPITAL_COMMUNITY)
Admission: EM | Admit: 2015-01-04 | Discharge: 2015-01-05 | Disposition: A | Discharge status: Home / Self Care | Payer: Medicare Other | Attending: Emergency Medicine | Admitting: Emergency Medicine

## 2015-01-04 DIAGNOSIS — Z880 Allergy status to penicillin: Secondary | ICD-10-CM | POA: Insufficient documentation

## 2015-01-04 DIAGNOSIS — M6281 Muscle weakness (generalized): Secondary | ICD-10-CM | POA: Diagnosis not present

## 2015-01-04 DIAGNOSIS — R278 Other lack of coordination: Secondary | ICD-10-CM | POA: Diagnosis not present

## 2015-01-04 DIAGNOSIS — Z8546 Personal history of malignant neoplasm of prostate: Secondary | ICD-10-CM | POA: Diagnosis not present

## 2015-01-04 DIAGNOSIS — F329 Major depressive disorder, single episode, unspecified: Secondary | ICD-10-CM | POA: Diagnosis not present

## 2015-01-04 DIAGNOSIS — Z7902 Long term (current) use of antithrombotics/antiplatelets: Secondary | ICD-10-CM | POA: Diagnosis not present

## 2015-01-04 DIAGNOSIS — S0003XA Contusion of scalp, initial encounter: Secondary | ICD-10-CM | POA: Diagnosis not present

## 2015-01-04 DIAGNOSIS — S098XXA Other specified injuries of head, initial encounter: Secondary | ICD-10-CM | POA: Diagnosis not present

## 2015-01-04 DIAGNOSIS — Z9181 History of falling: Secondary | ICD-10-CM | POA: Diagnosis not present

## 2015-01-04 DIAGNOSIS — I10 Essential (primary) hypertension: Secondary | ICD-10-CM | POA: Diagnosis not present

## 2015-01-04 DIAGNOSIS — Y92129 Unspecified place in nursing home as the place of occurrence of the external cause: Secondary | ICD-10-CM | POA: Insufficient documentation

## 2015-01-04 DIAGNOSIS — W01190A Fall on same level from slipping, tripping and stumbling with subsequent striking against furniture, initial encounter: Secondary | ICD-10-CM | POA: Insufficient documentation

## 2015-01-04 DIAGNOSIS — Z79899 Other long term (current) drug therapy: Secondary | ICD-10-CM | POA: Diagnosis not present

## 2015-01-04 DIAGNOSIS — S0101XA Laceration without foreign body of scalp, initial encounter: Secondary | ICD-10-CM | POA: Diagnosis not present

## 2015-01-04 DIAGNOSIS — Z8612 Personal history of poliomyelitis: Secondary | ICD-10-CM | POA: Diagnosis not present

## 2015-01-04 DIAGNOSIS — R262 Difficulty in walking, not elsewhere classified: Secondary | ICD-10-CM | POA: Diagnosis not present

## 2015-01-04 DIAGNOSIS — Y9301 Activity, walking, marching and hiking: Secondary | ICD-10-CM | POA: Insufficient documentation

## 2015-01-04 DIAGNOSIS — Z8673 Personal history of transient ischemic attack (TIA), and cerebral infarction without residual deficits: Secondary | ICD-10-CM | POA: Diagnosis not present

## 2015-01-04 DIAGNOSIS — R2681 Unsteadiness on feet: Secondary | ICD-10-CM | POA: Diagnosis not present

## 2015-01-04 DIAGNOSIS — S199XXA Unspecified injury of neck, initial encounter: Secondary | ICD-10-CM | POA: Diagnosis not present

## 2015-01-04 DIAGNOSIS — S0180XA Unspecified open wound of other part of head, initial encounter: Secondary | ICD-10-CM | POA: Diagnosis not present

## 2015-01-04 DIAGNOSIS — W010XXA Fall on same level from slipping, tripping and stumbling without subsequent striking against object, initial encounter: Secondary | ICD-10-CM

## 2015-01-04 DIAGNOSIS — Y998 Other external cause status: Secondary | ICD-10-CM | POA: Diagnosis not present

## 2015-01-04 DIAGNOSIS — N393 Stress incontinence (female) (male): Secondary | ICD-10-CM | POA: Diagnosis not present

## 2015-01-04 NOTE — ED Notes (Signed)
[facility] presents with a 81 yo male from [facility]Independent Living area with a unwitnessed fall.  Pt stated he was walking with walker, turned the wrong way and fell down hitting head on corner of dresser.  Staff was alerted by medic alert type alarm and call 911.  [facility] on scene witnessed a large amount of blood and 1 inch laceration to center of head/scalp.  Pt also has skin tear to right chin.  No neck or back pain, No LOC, No N/V.  No blood thinners.

## 2015-01-04 NOTE — ED Notes (Signed)
Bed: WA20 Expected date:  Expected time:  Means of arrival:  Comments: EMS 61M fall/head lac/hem controlled

## 2015-01-05 ENCOUNTER — Emergency Department (HOSPITAL_COMMUNITY): Payer: Medicare Other

## 2015-01-05 DIAGNOSIS — R259 Unspecified abnormal involuntary movements: Secondary | ICD-10-CM | POA: Diagnosis not present

## 2015-01-05 DIAGNOSIS — S0101XA Laceration without foreign body of scalp, initial encounter: Secondary | ICD-10-CM | POA: Diagnosis not present

## 2015-01-05 DIAGNOSIS — S199XXA Unspecified injury of neck, initial encounter: Secondary | ICD-10-CM | POA: Diagnosis not present

## 2015-01-05 DIAGNOSIS — S0003XA Contusion of scalp, initial encounter: Secondary | ICD-10-CM | POA: Diagnosis not present

## 2015-01-05 NOTE — ED Provider Notes (Signed)
CSN: [id number]     Arrival date & time 01/04/15  2338 History   First MD Initiated Contact with Patient 01/04/15 2346     Chief Complaint  Patient presents with  . Fall     (Consider location/radiation/quality/duration/timing/severity/associated sxs/prior Treatment) The history is provided by the patient and medical records. No language interpreter was used.     Ricardo [name] is a 81 y.o. male  with a hx of hypertension, polio, depression, prostate cancer, stroke, gait disorder presents to the Emergency Department complaining of acute, persistent laceration to the frontal scalp occurring just prior to arrival.  Per EMS patient lives in the independent living center at [facility] nursing center.  Patient reports he was ambulating to bed when he tripped and fell, striking his head on the corner of his dresser.  Patient reports no loss of consciousness.  He reports he summoned help with his medic alert bracelet.  EMS reports the patient was alert and oriented 4 upon their arrival. He denies neck or back pain. He denies nausea, vomiting, diarrhea, weakness, dizziness, syncope.  Patient reports that   he does not take blood thinners.    Past Medical History  Diagnosis Date  . Hypertension   . Polio   . Depression   . Cancer ([place])   . Prostate CA ([place])   . Stroke ([place])   . Gait disorder    Past Surgical History  Procedure Laterality Date  . Knee surgery     Family History  Problem Relation Age of Onset  . Heart disease Mother   . Heart disease Father   . Heart disease Grandchild    Social History  Substance Use Topics  . Smoking status: Never Smoker   . Smokeless tobacco: Never Used  . Alcohol Use: Yes     Comment: social    Review of Systems  Constitutional: Negative for fever, diaphoresis, appetite change, fatigue and unexpected weight change.  HENT: Negative for mouth sores.   Eyes: Negative for visual disturbance.  Respiratory: Negative for cough, chest tightness,  shortness of breath and wheezing.   Cardiovascular: Negative for chest pain.  Gastrointestinal: Negative for nausea, vomiting, abdominal pain, diarrhea and constipation.  Endocrine: Negative for polydipsia, polyphagia and polyuria.  Genitourinary: Negative for dysuria, urgency, frequency and hematuria.  Musculoskeletal: Negative for back pain and neck stiffness.  Skin: Positive for wound. Negative for rash.  Allergic/Immunologic: Negative for immunocompromised state.  Neurological: Negative for syncope, light-headedness and headaches.  Hematological: Does not bruise/bleed easily.  Psychiatric/Behavioral: Negative for sleep disturbance. The patient is not nervous/anxious.       Allergies  Naprosyn; Oxycontin; Penicillins; Quinine derivatives; Streptomycin; and Sulfa antibiotics  Home Medications   Prior to Admission medications   Medication Sig Start Date End Date Taking? Authorizing Provider  acetaminophen (TYLENOL) 500 MG tablet Take 500 mg by mouth every 6 (six) hours as needed for mild pain.   Yes Historical Provider, MD  amLODipine (NORVASC) 2.5 MG tablet Take 2.5 mg by mouth daily. 12/12/14  Yes Historical Provider, MD  carbidopa-levodopa (SINEMET IR) 25-100 MG per tablet Take 1 tablet by mouth 3 (three) times daily. At 8am, 2pm, and 8pm 09/12/12  Yes Penni Bombard, MD  Cholecalciferol (VITAMIN D3) 2000 UNITS TABS Take 2,000 Units by mouth daily.    Yes Historical Provider, MD  ciprofloxacin-dexamethasone (CIPRODEX) otic suspension Place 4 drops into the right ear 2 (two) times daily.   Yes Historical Provider, MD  citalopram (CELEXA) 40 MG tablet Take  40 mg by mouth daily. 12/12/14  Yes Historical Provider, MD  clopidogrel (PLAVIX) 75 MG tablet Take 75 mg by mouth daily.  08/23/12  Yes Historical Provider, MD  Emollient (MOISTURIZING LOTION EX) Apply 1 application topically 2 (two) times daily.   Yes Historical Provider, MD  ENSURE PLUS (ENSURE PLUS) LIQD Take 0.5 Cans by mouth  2 (two) times daily.   Yes Historical Provider, MD  HYDROcodone-acetaminophen (NORCO/VICODIN) 5-325 MG per tablet Take 1 tablet by mouth every 6 (six) hours as needed for moderate pain.   Yes Historical Provider, MD  lisinopril (PRINIVIL,ZESTRIL) 10 MG tablet Take 10 mg by mouth daily.   Yes Historical Provider, MD  LORazepam (ATIVAN) 0.5 MG tablet Take 0.5 mg by mouth at bedtime as needed for sleep.   Yes Historical Provider, MD  solifenacin (VESICARE) 5 MG tablet Take 5 mg by mouth daily.   Yes Historical Provider, MD  triamcinolone (KENALOG) 0.025 % cream Apply 1 application topically 2 (two) times daily. Apply to groin area   Yes Historical Provider, MD   BP 133/54 mmHg  Pulse 69  Temp(Src) 98 F (36.7 C) (Oral)  Resp 14  SpO2 95% Physical Exam  Constitutional: He is oriented to person, place, and time. He appears well-developed and well-nourished. No distress.  HENT:  Head: Normocephalic.  Right Ear: Tympanic membrane, external ear and ear canal normal.  Left Ear: Tympanic membrane, external ear and ear canal normal.  Nose: Nose normal. No epistaxis. Right sinus exhibits no maxillary sinus tenderness and no frontal sinus tenderness. Left sinus exhibits no maxillary sinus tenderness and no frontal sinus tenderness.  Mouth/Throat: Uvula is midline, oropharynx is clear and moist and mucous membranes are normal. Mucous membranes are not pale and not cyanotic. No oropharyngeal exudate, posterior oropharyngeal edema, posterior oropharyngeal erythema or tonsillar abscesses.  Small amount of ecchymosis noted to the right side of the face No tenderness to palpation along the inferior orbital rim 2 cm laceration to the scalp  Eyes: Conjunctivae and EOM are normal. Pupils are equal, round, and reactive to light. No scleral icterus.  No horizontal, vertical or rotational nystagmus  Neck: Normal range of motion and full passive range of motion without pain. Neck supple.  Full active and passive  ROM without pain No midline or paraspinal tenderness No nuchal rigidity or meningeal signs  Cardiovascular: Normal rate, regular rhythm, normal heart sounds and intact distal pulses.   Pulmonary/Chest: Effort normal and breath sounds normal. No stridor. No respiratory distress. He has no wheezes. He has no rales.  Clear and equal breath sounds without focal wheezes, rhonchi, rales  Abdominal: Soft. Bowel sounds are normal. There is no tenderness. There is no rebound and no guarding.  Musculoskeletal: Normal range of motion.  Lymphadenopathy:    He has no cervical adenopathy.  Neurological: He is alert and oriented to person, place, and time. He has normal reflexes. No cranial nerve deficit. He exhibits normal muscle tone. Coordination normal.  Mental Status:  Alert, oriented, thought content appropriate. Speech fluent without evidence of aphasia. Able to follow 2 step commands without difficulty.  Cranial Nerves:  II:  pupils equal, round, reactive to light III,IV, VI: ptosis not present, extra-ocular motions intact bilaterally  V,VII: smile symmetric, facial light touch sensation equal VIII: hearing grossly normal bilaterally  IX,X: midline uvula rise  XI: bilateral shoulder shrug equal and strong XII: midline tongue extension  Motor:  5/5 in upper and lower extremities bilaterally including strong and equal grip strength  and dorsiflexion/plantar flexion Sensory: Pinprick and light touch normal in all extremities.  Deep Tendon Reflexes: 2+ and symmetric  Cerebellar: normal finger-to-nose with bilateral upper extremities Gait: normal gait and balance with walker CV: distal pulses palpable throughout   Skin: Skin is warm and dry. No rash noted. He is not diaphoretic.  Psychiatric: He has a normal mood and affect. His behavior is normal. Judgment and thought content normal.  Nursing note and vitals reviewed.   ED Course  .Marland KitchenLaceration Repair Date/Time: 01/05/2015 4:24 AM Performed  by: Abigail Butts Authorized by: Abigail Butts Consent: Verbal consent obtained. Risks and benefits: risks, benefits and alternatives were discussed Consent given by: patient Patient understanding: patient states understanding of the procedure being performed Patient consent: the patient's understanding of the procedure matches consent given Procedure consent: procedure consent matches procedure scheduled Relevant documents: relevant documents present and verified Site marked: the operative site was marked Imaging studies: imaging studies available Required items: required blood products, implants, devices, and special equipment available Patient identity confirmed: verbally with patient and arm band Time out: Immediately prior to procedure a "time out" was called to verify the correct patient, procedure, equipment, support staff and site/side marked as required. Body area: head/neck Location details: scalp Laceration length: 2 cm Foreign bodies: no foreign bodies Tendon involvement: none Nerve involvement: none Vascular damage: no Patient sedated: no Preparation: Patient was prepped and draped in the usual sterile fashion. Irrigation solution: saline Irrigation method: syringe Amount of cleaning: extensive Skin closure: staples Number of sutures: 2 Approximation: close Dressing: 4x4 sterile gauze Patient tolerance: Patient tolerated the procedure well with no immediate complications   (including critical care time) Labs Review Labs Reviewed - No data to display  Imaging Review Ct Head Wo Contrast  01/05/2015  CLINICAL DATA:  Unwitnessed fall while using walker, struck head on corner of dresser. No loss of consciousness. Not on blood thinners. History of hypertension, prostate cancer, stroke, parkinsonism. EXAM: CT HEAD WITHOUT CONTRAST CT CERVICAL SPINE WITHOUT CONTRAST TECHNIQUE: Multidetector CT imaging of the head and cervical spine was performed following the  standard protocol without intravenous contrast. Multiplanar CT image reconstructions of the cervical spine were also generated. COMPARISON:  CT head and cervical spine September 25, 2014 FINDINGS: CT HEAD FINDINGS The ventricles and sulci are normal for age. No intraparenchymal hemorrhage, mass effect nor midline shift. Patchy supratentorial white matter hypodensities are within normal range for patient's age and though non-specific suggest sequelae of chronic small vessel ischemic disease. No acute large vascular territory infarcts. Old bilateral basal ganglia and thalamus infarcts. No abnormal extra-axial fluid collections. Basal cisterns are patent. Mild calcific atherosclerosis of the carotid siphons. Dolicoectatic intracranial vessels compatible chronic hypertension. No skull fracture. Small RIGHT frontal scalp hematoma at the vertex with minimal subcutaneous gas compatible with laceration. The included ocular globes and orbital contents are non-suspicious. The mastoid aircells and included paranasal sinuses are well-aerated. CT CERVICAL SPINE FINDINGS Cervical vertebral bodies and posterior elements intact. Minimal grade 1 C4-5 anterolisthesis and, grade 1 C7-T1 anterolisthesis, unchanged. No spondylolysis. C4-5 and C5-6 facets are fused on degenerative basis. No destructive bony lesions. C1-2 articulation maintained with moderate arthropathy. Small amount of calcified pannus most often seen the CPPD about the odontoid process. Scattered chronic Schmorl's nodes. Severe bilateral C3-4, LEFT C4-5, RIGHT C5-6 and bilateral C6-7 neural foraminal narrowing. Severe canal stenosis at C3-4 due to calcified posterior longitudinal ligament. IMPRESSION: CT HEAD: Small RIGHT frontal scalp hematoma and apparent laceration. No skull fracture. No acute intracranial  process. Stable appearance of the head from September 25, 2014 including involutional changes, moderate to severe chronic small vessel ischemic disease and old lacunar  infarcts. CT CERVICAL SPINE: No acute cervical spine fracture. Stable grade 1 C4-5 and C7-T1 anterolisthesis on degenerative basis. Multilevel severe neural foraminal narrowing. Severe canal stenosis at C3-4, unchanged. Electronically Signed   By: Elon Alas M.D.   On: 01/05/2015 02:07   Ct Cervical Spine Wo Contrast  01/05/2015  CLINICAL DATA:  Unwitnessed fall while using walker, struck head on corner of dresser. No loss of consciousness. Not on blood thinners. History of hypertension, prostate cancer, stroke, parkinsonism. EXAM: CT HEAD WITHOUT CONTRAST CT CERVICAL SPINE WITHOUT CONTRAST TECHNIQUE: Multidetector CT imaging of the head and cervical spine was performed following the standard protocol without intravenous contrast. Multiplanar CT image reconstructions of the cervical spine were also generated. COMPARISON:  CT head and cervical spine September 25, 2014 FINDINGS: CT HEAD FINDINGS The ventricles and sulci are normal for age. No intraparenchymal hemorrhage, mass effect nor midline shift. Patchy supratentorial white matter hypodensities are within normal range for patient's age and though non-specific suggest sequelae of chronic small vessel ischemic disease. No acute large vascular territory infarcts. Old bilateral basal ganglia and thalamus infarcts. No abnormal extra-axial fluid collections. Basal cisterns are patent. Mild calcific atherosclerosis of the carotid siphons. Dolicoectatic intracranial vessels compatible chronic hypertension. No skull fracture. Small RIGHT frontal scalp hematoma at the vertex with minimal subcutaneous gas compatible with laceration. The included ocular globes and orbital contents are non-suspicious. The mastoid aircells and included paranasal sinuses are well-aerated. CT CERVICAL SPINE FINDINGS Cervical vertebral bodies and posterior elements intact. Minimal grade 1 C4-5 anterolisthesis and, grade 1 C7-T1 anterolisthesis, unchanged. No spondylolysis. C4-5 and C5-6  facets are fused on degenerative basis. No destructive bony lesions. C1-2 articulation maintained with moderate arthropathy. Small amount of calcified pannus most often seen the CPPD about the odontoid process. Scattered chronic Schmorl's nodes. Severe bilateral C3-4, LEFT C4-5, RIGHT C5-6 and bilateral C6-7 neural foraminal narrowing. Severe canal stenosis at C3-4 due to calcified posterior longitudinal ligament. IMPRESSION: CT HEAD: Small RIGHT frontal scalp hematoma and apparent laceration. No skull fracture. No acute intracranial process. Stable appearance of the head from September 25, 2014 including involutional changes, moderate to severe chronic small vessel ischemic disease and old lacunar infarcts. CT CERVICAL SPINE: No acute cervical spine fracture. Stable grade 1 C4-5 and C7-T1 anterolisthesis on degenerative basis. Multilevel severe neural foraminal narrowing. Severe canal stenosis at C3-4, unchanged. Electronically Signed   By: Elon Alas M.D.   On: 01/05/2015 02:07   I have personally reviewed and evaluated these images and lab results as part of my medical decision-making.   EKG Interpretation None      MDM   Final diagnoses:  Fall from slip, trip, or stumble, initial encounter  Scalp laceration, initial encounter   Jamarqus Mcshea presents with mechanical fall.  CT scan of head and neck without acute abnormality. Patient relates here in the emergency department with his walker without difficulty. Laceration to his head repaired after visualization of the base of the wound in a bloodless field. Patient reports his tetanus is up-to-date.  Patient will need staple removal in approximately 5 days. Recommend he see his primary care for this. No antibiotics indicated at this time.  Strict return precautions discussed including potential for delayed bleed.  The patient was discussed with and seen by Dr. Leonides Schanz who agrees with the treatment plan.  BP 133/54 mmHg  Pulse 69  Temp(Src) 98  F (36.7 C) (Oral)  Resp 14  SpO2 95%    Abigail Butts, PA-C 01/05/15 Tipton Ward, DO 01/05/15 [phone]

## 2015-01-05 NOTE — ED Notes (Signed)
[facility] taking patient back to [place].

## 2015-01-05 NOTE — ED Notes (Signed)
[facility] called by this RN. The report they will be here as soon as they can.

## 2015-01-05 NOTE — Discharge Instructions (Signed)
1. Medications: Tylenol or ibuprofen for pain, usual home medications 2. Treatment: ice for swelling, keep wound clean with warm soap and water and keep bandage dry, do not submerge in water for 24 hours 3. Follow Up: Please return in 5 days to have your stitches/staples removed or sooner if you have concerns. Return to the emergency department for increased redness, drainage of pus from the wound   WOUND CARE  Keep area clean and dry for 24 hours. Do not remove bandage, if applied.  After 24 hours, remove bandage and wash wound gently with mild soap and warm water. Reapply a new bandage after cleaning wound, if directed.   Continue daily cleansing with soap and water until stitches/staples are removed.  Do not apply any ointments or creams to the wound while stitches/staples are in place, as this may cause delayed healing. Return if you experience any of the following signs of infection: Swelling, redness, pus drainage, streaking, fever >101.0 F  Return if you experience excessive bleeding that does not stop after 15-20 minutes of constant, firm pressure.    Head Injury, Adult You have a head injury. Headaches and throwing up (vomiting) are common after a head injury. It should be easy to wake up from sleeping. Sometimes you must stay in the hospital. Most problems happen within the first 24 hours. Side effects may occur up to 7-10 days after the injury.  WHAT ARE THE TYPES OF HEAD INJURIES? Head injuries can be as minor as a bump. Some head injuries can be more severe. More severe head injuries include:  A jarring injury to the brain (concussion).  A bruise of the brain (contusion). This mean there is bleeding in the brain that can cause swelling.  A cracked skull (skull fracture).  Bleeding in the brain that collects, clots, and forms a bump (hematoma). WHEN SHOULD I GET HELP RIGHT AWAY?   You are confused or sleepy.  You cannot be woken up.  You feel sick to your stomach  (nauseous) or keep throwing up (vomiting).  Your dizziness or unsteadiness is getting worse.  You have very bad, lasting headaches that are not helped by medicine. Take medicines only as told by your doctor.  You cannot use your arms or legs like normal.  You cannot walk.  You notice changes in the black spots in the center of the colored part of your eye (pupil).  You have clear or bloody fluid coming from your nose or ears.  You have trouble seeing. During the next 24 hours after the injury, you must stay with someone who can watch you. This person should get help right away (call 911 in the [place] if you start to shake and are not able to control it (have seizures), you pass out, or you are unable to wake up. HOW CAN I PREVENT A HEAD INJURY IN THE FUTURE?  Wear seat belts.  Wear a helmet while bike riding and playing sports like football.  Stay away from dangerous activities around the house. WHEN CAN I RETURN TO NORMAL ACTIVITIES AND ATHLETICS? See your doctor before doing these activities. You should not do normal activities or play contact sports until 1 week after the following symptoms have stopped:  Headache that does not go away.  Dizziness.  Poor attention.  Confusion.  Memory problems.  Sickness to your stomach or throwing up.  Tiredness.  Fussiness.  Bothered by bright lights or loud noises.  Anxiousness or depression.  Restless sleep. MAKE SURE YOU:  Understand these instructions.  Will watch your condition.  Will get help right away if you are not doing well or get worse.   This information is not intended to replace advice given to you by your health care provider. Make sure you discuss any questions you have with your health care provider.   Document Released: 01/16/2008 Document Revised: 02/23/2014 Document Reviewed: 10/10/2012 Elsevier Interactive Patient Education [employer].

## 2015-01-05 NOTE — ED Notes (Signed)
Pt ambulated in room with walker.  Pt states "I' ready to go home".

## 2015-01-07 DIAGNOSIS — R2681 Unsteadiness on feet: Secondary | ICD-10-CM | POA: Diagnosis not present

## 2015-01-07 DIAGNOSIS — M6281 Muscle weakness (generalized): Secondary | ICD-10-CM | POA: Diagnosis not present

## 2015-01-07 DIAGNOSIS — R278 Other lack of coordination: Secondary | ICD-10-CM | POA: Diagnosis not present

## 2015-01-07 DIAGNOSIS — R262 Difficulty in walking, not elsewhere classified: Secondary | ICD-10-CM | POA: Diagnosis not present

## 2015-01-07 DIAGNOSIS — Z9181 History of falling: Secondary | ICD-10-CM | POA: Diagnosis not present

## 2015-01-07 DIAGNOSIS — N393 Stress incontinence (female) (male): Secondary | ICD-10-CM | POA: Diagnosis not present

## 2015-01-08 DIAGNOSIS — M6281 Muscle weakness (generalized): Secondary | ICD-10-CM | POA: Diagnosis not present

## 2015-01-08 DIAGNOSIS — R262 Difficulty in walking, not elsewhere classified: Secondary | ICD-10-CM | POA: Diagnosis not present

## 2015-01-08 DIAGNOSIS — Z9181 History of falling: Secondary | ICD-10-CM | POA: Diagnosis not present

## 2015-01-08 DIAGNOSIS — R278 Other lack of coordination: Secondary | ICD-10-CM | POA: Diagnosis not present

## 2015-01-08 DIAGNOSIS — R2681 Unsteadiness on feet: Secondary | ICD-10-CM | POA: Diagnosis not present

## 2015-01-08 DIAGNOSIS — N393 Stress incontinence (female) (male): Secondary | ICD-10-CM | POA: Diagnosis not present

## 2015-01-09 DIAGNOSIS — H6121 Impacted cerumen, right ear: Secondary | ICD-10-CM | POA: Diagnosis not present

## 2015-01-09 DIAGNOSIS — Z4802 Encounter for removal of sutures: Secondary | ICD-10-CM | POA: Diagnosis not present

## 2015-01-14 DIAGNOSIS — R278 Other lack of coordination: Secondary | ICD-10-CM | POA: Diagnosis not present

## 2015-01-14 DIAGNOSIS — N393 Stress incontinence (female) (male): Secondary | ICD-10-CM | POA: Diagnosis not present

## 2015-01-14 DIAGNOSIS — M6281 Muscle weakness (generalized): Secondary | ICD-10-CM | POA: Diagnosis not present

## 2015-01-14 DIAGNOSIS — R262 Difficulty in walking, not elsewhere classified: Secondary | ICD-10-CM | POA: Diagnosis not present

## 2015-01-14 DIAGNOSIS — Z9181 History of falling: Secondary | ICD-10-CM | POA: Diagnosis not present

## 2015-01-14 DIAGNOSIS — R2681 Unsteadiness on feet: Secondary | ICD-10-CM | POA: Diagnosis not present

## 2015-01-15 DIAGNOSIS — M6281 Muscle weakness (generalized): Secondary | ICD-10-CM | POA: Diagnosis not present

## 2015-01-15 DIAGNOSIS — R531 Weakness: Secondary | ICD-10-CM | POA: Diagnosis not present

## 2015-01-15 DIAGNOSIS — R2681 Unsteadiness on feet: Secondary | ICD-10-CM | POA: Diagnosis not present

## 2015-01-15 DIAGNOSIS — N393 Stress incontinence (female) (male): Secondary | ICD-10-CM | POA: Diagnosis not present

## 2015-01-15 DIAGNOSIS — R262 Difficulty in walking, not elsewhere classified: Secondary | ICD-10-CM | POA: Diagnosis not present

## 2015-01-15 DIAGNOSIS — R278 Other lack of coordination: Secondary | ICD-10-CM | POA: Diagnosis not present

## 2015-01-15 DIAGNOSIS — Z9181 History of falling: Secondary | ICD-10-CM | POA: Diagnosis not present

## 2015-01-17 DIAGNOSIS — N393 Stress incontinence (female) (male): Secondary | ICD-10-CM | POA: Diagnosis not present

## 2015-01-17 DIAGNOSIS — R2681 Unsteadiness on feet: Secondary | ICD-10-CM | POA: Diagnosis not present

## 2015-01-17 DIAGNOSIS — R262 Difficulty in walking, not elsewhere classified: Secondary | ICD-10-CM | POA: Diagnosis not present

## 2015-01-17 DIAGNOSIS — M6281 Muscle weakness (generalized): Secondary | ICD-10-CM | POA: Diagnosis not present

## 2015-01-17 DIAGNOSIS — R278 Other lack of coordination: Secondary | ICD-10-CM | POA: Diagnosis not present

## 2015-01-17 DIAGNOSIS — Z9181 History of falling: Secondary | ICD-10-CM | POA: Diagnosis not present

## 2015-01-18 DIAGNOSIS — M6281 Muscle weakness (generalized): Secondary | ICD-10-CM | POA: Diagnosis not present

## 2015-01-18 DIAGNOSIS — N393 Stress incontinence (female) (male): Secondary | ICD-10-CM | POA: Diagnosis not present

## 2015-01-18 DIAGNOSIS — R262 Difficulty in walking, not elsewhere classified: Secondary | ICD-10-CM | POA: Diagnosis not present

## 2015-01-18 DIAGNOSIS — R278 Other lack of coordination: Secondary | ICD-10-CM | POA: Diagnosis not present

## 2015-01-18 DIAGNOSIS — Z9181 History of falling: Secondary | ICD-10-CM | POA: Diagnosis not present

## 2015-01-18 DIAGNOSIS — R2681 Unsteadiness on feet: Secondary | ICD-10-CM | POA: Diagnosis not present

## 2015-01-21 DIAGNOSIS — Z9181 History of falling: Secondary | ICD-10-CM | POA: Diagnosis not present

## 2015-01-21 DIAGNOSIS — R2681 Unsteadiness on feet: Secondary | ICD-10-CM | POA: Diagnosis not present

## 2015-01-21 DIAGNOSIS — R262 Difficulty in walking, not elsewhere classified: Secondary | ICD-10-CM | POA: Diagnosis not present

## 2015-01-21 DIAGNOSIS — M6281 Muscle weakness (generalized): Secondary | ICD-10-CM | POA: Diagnosis not present

## 2015-01-21 DIAGNOSIS — N393 Stress incontinence (female) (male): Secondary | ICD-10-CM | POA: Diagnosis not present

## 2015-01-21 DIAGNOSIS — R278 Other lack of coordination: Secondary | ICD-10-CM | POA: Diagnosis not present

## 2015-01-22 DIAGNOSIS — N393 Stress incontinence (female) (male): Secondary | ICD-10-CM | POA: Diagnosis not present

## 2015-01-22 DIAGNOSIS — M6281 Muscle weakness (generalized): Secondary | ICD-10-CM | POA: Diagnosis not present

## 2015-01-22 DIAGNOSIS — R278 Other lack of coordination: Secondary | ICD-10-CM | POA: Diagnosis not present

## 2015-01-22 DIAGNOSIS — R262 Difficulty in walking, not elsewhere classified: Secondary | ICD-10-CM | POA: Diagnosis not present

## 2015-01-22 DIAGNOSIS — R2681 Unsteadiness on feet: Secondary | ICD-10-CM | POA: Diagnosis not present

## 2015-01-22 DIAGNOSIS — Z9181 History of falling: Secondary | ICD-10-CM | POA: Diagnosis not present

## 2015-01-23 DIAGNOSIS — Z9181 History of falling: Secondary | ICD-10-CM | POA: Diagnosis not present

## 2015-01-23 DIAGNOSIS — M6281 Muscle weakness (generalized): Secondary | ICD-10-CM | POA: Diagnosis not present

## 2015-01-23 DIAGNOSIS — R2681 Unsteadiness on feet: Secondary | ICD-10-CM | POA: Diagnosis not present

## 2015-01-23 DIAGNOSIS — R262 Difficulty in walking, not elsewhere classified: Secondary | ICD-10-CM | POA: Diagnosis not present

## 2015-01-23 DIAGNOSIS — N393 Stress incontinence (female) (male): Secondary | ICD-10-CM | POA: Diagnosis not present

## 2015-01-23 DIAGNOSIS — R278 Other lack of coordination: Secondary | ICD-10-CM | POA: Diagnosis not present

## 2015-01-25 DIAGNOSIS — R2681 Unsteadiness on feet: Secondary | ICD-10-CM | POA: Diagnosis not present

## 2015-01-25 DIAGNOSIS — R262 Difficulty in walking, not elsewhere classified: Secondary | ICD-10-CM | POA: Diagnosis not present

## 2015-01-25 DIAGNOSIS — M6281 Muscle weakness (generalized): Secondary | ICD-10-CM | POA: Diagnosis not present

## 2015-01-25 DIAGNOSIS — R278 Other lack of coordination: Secondary | ICD-10-CM | POA: Diagnosis not present

## 2015-01-25 DIAGNOSIS — Z9181 History of falling: Secondary | ICD-10-CM | POA: Diagnosis not present

## 2015-01-25 DIAGNOSIS — N393 Stress incontinence (female) (male): Secondary | ICD-10-CM | POA: Diagnosis not present

## 2015-01-29 DIAGNOSIS — M6281 Muscle weakness (generalized): Secondary | ICD-10-CM | POA: Diagnosis not present

## 2015-01-29 DIAGNOSIS — Z9181 History of falling: Secondary | ICD-10-CM | POA: Diagnosis not present

## 2015-01-29 DIAGNOSIS — R262 Difficulty in walking, not elsewhere classified: Secondary | ICD-10-CM | POA: Diagnosis not present

## 2015-01-29 DIAGNOSIS — R2681 Unsteadiness on feet: Secondary | ICD-10-CM | POA: Diagnosis not present

## 2015-01-29 DIAGNOSIS — R278 Other lack of coordination: Secondary | ICD-10-CM | POA: Diagnosis not present

## 2015-01-29 DIAGNOSIS — N393 Stress incontinence (female) (male): Secondary | ICD-10-CM | POA: Diagnosis not present

## 2015-01-30 DIAGNOSIS — R2681 Unsteadiness on feet: Secondary | ICD-10-CM | POA: Diagnosis not present

## 2015-01-30 DIAGNOSIS — N393 Stress incontinence (female) (male): Secondary | ICD-10-CM | POA: Diagnosis not present

## 2015-01-30 DIAGNOSIS — R262 Difficulty in walking, not elsewhere classified: Secondary | ICD-10-CM | POA: Diagnosis not present

## 2015-01-30 DIAGNOSIS — R278 Other lack of coordination: Secondary | ICD-10-CM | POA: Diagnosis not present

## 2015-01-30 DIAGNOSIS — M6281 Muscle weakness (generalized): Secondary | ICD-10-CM | POA: Diagnosis not present

## 2015-01-30 DIAGNOSIS — Z9181 History of falling: Secondary | ICD-10-CM | POA: Diagnosis not present

## 2015-01-31 DIAGNOSIS — N393 Stress incontinence (female) (male): Secondary | ICD-10-CM | POA: Diagnosis not present

## 2015-01-31 DIAGNOSIS — R2681 Unsteadiness on feet: Secondary | ICD-10-CM | POA: Diagnosis not present

## 2015-01-31 DIAGNOSIS — Z9181 History of falling: Secondary | ICD-10-CM | POA: Diagnosis not present

## 2015-01-31 DIAGNOSIS — R262 Difficulty in walking, not elsewhere classified: Secondary | ICD-10-CM | POA: Diagnosis not present

## 2015-01-31 DIAGNOSIS — R278 Other lack of coordination: Secondary | ICD-10-CM | POA: Diagnosis not present

## 2015-01-31 DIAGNOSIS — M6281 Muscle weakness (generalized): Secondary | ICD-10-CM | POA: Diagnosis not present

## 2015-02-01 DIAGNOSIS — Z9181 History of falling: Secondary | ICD-10-CM | POA: Diagnosis not present

## 2015-02-01 DIAGNOSIS — M6281 Muscle weakness (generalized): Secondary | ICD-10-CM | POA: Diagnosis not present

## 2015-02-01 DIAGNOSIS — R262 Difficulty in walking, not elsewhere classified: Secondary | ICD-10-CM | POA: Diagnosis not present

## 2015-02-01 DIAGNOSIS — R2681 Unsteadiness on feet: Secondary | ICD-10-CM | POA: Diagnosis not present

## 2015-02-01 DIAGNOSIS — R278 Other lack of coordination: Secondary | ICD-10-CM | POA: Diagnosis not present

## 2015-02-01 DIAGNOSIS — N393 Stress incontinence (female) (male): Secondary | ICD-10-CM | POA: Diagnosis not present

## 2015-02-04 ENCOUNTER — Emergency Department (HOSPITAL_COMMUNITY): Payer: Medicare Other

## 2015-02-04 ENCOUNTER — Emergency Department (HOSPITAL_COMMUNITY)
Admission: EM | Admit: 2015-02-04 | Discharge: 2015-02-05 | Disposition: A | Discharge status: Home / Self Care | Payer: Medicare Other | Attending: Emergency Medicine | Admitting: Emergency Medicine

## 2015-02-04 DIAGNOSIS — Z9181 History of falling: Secondary | ICD-10-CM | POA: Diagnosis not present

## 2015-02-04 DIAGNOSIS — R42 Dizziness and giddiness: Secondary | ICD-10-CM | POA: Diagnosis not present

## 2015-02-04 DIAGNOSIS — R262 Difficulty in walking, not elsewhere classified: Secondary | ICD-10-CM | POA: Diagnosis not present

## 2015-02-04 DIAGNOSIS — Z79899 Other long term (current) drug therapy: Secondary | ICD-10-CM | POA: Insufficient documentation

## 2015-02-04 DIAGNOSIS — Z8673 Personal history of transient ischemic attack (TIA), and cerebral infarction without residual deficits: Secondary | ICD-10-CM | POA: Insufficient documentation

## 2015-02-04 DIAGNOSIS — Z8546 Personal history of malignant neoplasm of prostate: Secondary | ICD-10-CM | POA: Diagnosis not present

## 2015-02-04 DIAGNOSIS — Z8612 Personal history of poliomyelitis: Secondary | ICD-10-CM | POA: Diagnosis not present

## 2015-02-04 DIAGNOSIS — Z880 Allergy status to penicillin: Secondary | ICD-10-CM | POA: Insufficient documentation

## 2015-02-04 DIAGNOSIS — R112 Nausea with vomiting, unspecified: Secondary | ICD-10-CM | POA: Diagnosis not present

## 2015-02-04 DIAGNOSIS — I10 Essential (primary) hypertension: Secondary | ICD-10-CM | POA: Insufficient documentation

## 2015-02-04 DIAGNOSIS — F329 Major depressive disorder, single episode, unspecified: Secondary | ICD-10-CM | POA: Diagnosis not present

## 2015-02-04 DIAGNOSIS — Z7902 Long term (current) use of antithrombotics/antiplatelets: Secondary | ICD-10-CM | POA: Insufficient documentation

## 2015-02-04 DIAGNOSIS — R2681 Unsteadiness on feet: Secondary | ICD-10-CM | POA: Diagnosis not present

## 2015-02-04 DIAGNOSIS — R278 Other lack of coordination: Secondary | ICD-10-CM | POA: Diagnosis not present

## 2015-02-04 DIAGNOSIS — R1111 Vomiting without nausea: Secondary | ICD-10-CM | POA: Diagnosis not present

## 2015-02-04 DIAGNOSIS — N393 Stress incontinence (female) (male): Secondary | ICD-10-CM | POA: Diagnosis not present

## 2015-02-04 DIAGNOSIS — M6281 Muscle weakness (generalized): Secondary | ICD-10-CM | POA: Diagnosis not present

## 2015-02-04 DIAGNOSIS — E860 Dehydration: Secondary | ICD-10-CM | POA: Diagnosis not present

## 2015-02-04 LAB — CBC
HCT: 37.2 % — ABNORMAL LOW (ref 39.0–52.0)
Hemoglobin: 12.2 g/dL — ABNORMAL LOW (ref 13.0–17.0)
MCH: 31.9 pg (ref 26.0–34.0)
MCHC: 32.8 g/dL (ref 30.0–36.0)
MCV: 97.4 fL (ref 78.0–100.0)
Platelets: 166 10*3/uL (ref 150–400)
RBC: 3.82 MIL/uL — ABNORMAL LOW (ref 4.22–5.81)
RDW: 13.4 % (ref 11.5–15.5)
WBC: 11 10*3/uL — ABNORMAL HIGH (ref 4.0–10.5)

## 2015-02-04 LAB — URINALYSIS, ROUTINE W REFLEX MICROSCOPIC
Bilirubin Urine: NEGATIVE
Glucose, UA: NEGATIVE mg/dL
Hgb urine dipstick: NEGATIVE
Ketones, ur: 15 mg/dL — AB
Leukocytes, UA: NEGATIVE
Nitrite: NEGATIVE
Protein, ur: NEGATIVE mg/dL
Specific Gravity, Urine: 1.024 (ref 1.005–1.030)
pH: 5 (ref 5.0–8.0)

## 2015-02-04 LAB — BASIC METABOLIC PANEL
Anion gap: 7 (ref 5–15)
BUN: 23 mg/dL — AB (ref 6–20)
CHLORIDE: 107 mmol/L (ref 101–111)
CO2: 23 mmol/L (ref 22–32)
CREATININE: 1.16 mg/dL (ref 0.61–1.24)
Calcium: 7.8 mg/dL — ABNORMAL LOW (ref 8.9–10.3)
GFR calc Af Amer: >60 mL/min (ref >60)
GFR calc non Af Amer: 53 mL/min — ABNORMAL LOW (ref >60)
Glucose, Bld: 105 mg/dL — ABNORMAL HIGH (ref 65–99)
POTASSIUM: 4.2 mmol/L (ref 3.5–5.1)
Sodium: 137 mmol/L (ref 135–145)

## 2015-02-04 LAB — TROPONIN I

## 2015-02-04 MED ORDER — MECLIZINE HCL 25 MG PO TABS
12.5000 mg | ORAL_TABLET | Freq: Once | ORAL | Status: AC
  Administered 2015-02-04: 12.5 mg via ORAL
  Filled 2015-02-04: qty 1

## 2015-02-04 NOTE — ED Notes (Signed)
Patient arrived via [facility]. EMS reports: patient is a resident at [facility] at [facility]. DNR in hand. Patient awoke this morning with dizziness, and has experienced dizziness intermittently throughout the day. After dinner, patient experienced nausea, vomiting and continued dizziness. EMS called. 20 gauge in R AC. EMS administered NS 500 ml and Zofran 4 mg IV. BP 144/96, Pulse 86, 96% on room air. CBG 121. Upon arrival to ED, denies pain, nausea, or dizziness.

## 2015-02-04 NOTE — Discharge Instructions (Signed)
It was our pleasure to provide your ER care today - we hope that you feel better.  Rest. Drink adequate fluids.   Continue plavix/home meds.   Follow up with your primary care doctor in the next couple of days - call office tomorrow morning to arrange that follow up with them.   Return to ER right away if worse, new symptoms, severe dizziness, fainting, chest pain, trouble breathing, change in speech or vision, one-sided numbness/weakness, other concern.      Dizziness Dizziness is a common problem. It is a feeling of unsteadiness or light-headedness. You may feel like you are about to faint. Dizziness can lead to injury if you stumble or fall. Anyone can become dizzy, but dizziness is more common in older adults. This condition can be caused by a number of things, including medicines, dehydration, or illness. HOME CARE INSTRUCTIONS Taking these steps may help with your condition: Eating and Drinking  Drink enough fluid to keep your urine clear or pale yellow. This helps to keep you from becoming dehydrated. Try to drink more clear fluids, such as water.  Do not drink alcohol.  Limit your caffeine intake if directed by your health care provider.  Limit your salt intake if directed by your health care provider. Activity  Avoid making quick movements.  Rise slowly from chairs and steady yourself until you feel okay.  In the morning, first sit up on the side of the bed. When you feel okay, stand slowly while you hold onto something until you know that your balance is fine.  Move your legs often if you need to stand in one place for a long time. Tighten and relax your muscles in your legs while you are standing.  Do not drive or operate heavy machinery if you feel dizzy.  Avoid bending down if you feel dizzy. Place items in your home so that they are easy for you to reach without leaning over. Lifestyle  Do not use any tobacco products, including cigarettes, chewing tobacco, or  electronic cigarettes. If you need help quitting, ask your health care provider.  Try to reduce your stress level, such as with yoga or meditation. Talk with your health care provider if you need help. General Instructions  Watch your dizziness for any changes.  Take medicines only as directed by your health care provider. Talk with your health care provider if you think that your dizziness is caused by a medicine that you are taking.  Tell a friend or a family member that you are feeling dizzy. If he or she notices any changes in your behavior, have this person call your health care provider.  Keep all follow-up visits as directed by your health care provider. This is important. SEEK MEDICAL CARE IF:  Your dizziness does not go away.  Your dizziness or light-headedness gets worse.  You feel nauseous.  You have reduced hearing.  You have new symptoms.  You are unsteady on your feet or you feel like the room is spinning. SEEK IMMEDIATE MEDICAL CARE IF:  You vomit or have diarrhea and are unable to eat or drink anything.  You have problems talking, walking, swallowing, or using your arms, hands, or legs.  You feel generally weak.  You are not thinking clearly or you have trouble forming sentences. It may take a friend or family member to notice this.  You have chest pain, abdominal pain, shortness of breath, or sweating.  Your vision changes.  You notice any bleeding.  You have a headache.  You have neck pain or a stiff neck.  You have a fever.   This information is not intended to replace advice given to you by your health care provider. Make sure you discuss any questions you have with your health care provider.   Document Released: 07/29/2000 Document Revised: 06/19/2014 Document Reviewed: 01/29/2014 Elsevier Interactive Patient Education [employer].

## 2015-02-04 NOTE — ED Provider Notes (Signed)
CSN: [id number]     Arrival date & time 02/04/15  1915 History   First MD Initiated Contact with Patient 02/04/15 1926     Chief Complaint  Patient presents with  . Dizziness  . Emesis     (Consider location/radiation/quality/duration/timing/severity/associated sxs/prior Treatment) Patient is a 81 y.o. male presenting with dizziness and vomiting. The history is provided by the patient.  Dizziness Associated symptoms: nausea and vomiting   Associated symptoms: no chest pain, no diarrhea, no headaches, no shortness of breath, no tinnitus and no weakness   Emesis Associated symptoms: no abdominal pain, no chills, no diarrhea, no headaches and no sore throat   Patient presents w onset dizziness this AM, acute onset. At rest. Pt describes room spinning sensation. Worse w head movements/change in position. +nausea. Had episode emesis earlier, not bloody.  Denies ear pain, tinnitus or acute hearing loss. Denies hx same. Pt denies sore throat, nasal congestion, cough or uri c/o. No fever or chills. No headache. No syncope. Denies change in speech or vision. No extremity numbness/weakness or loss of functional ability.  At baseline, pt indicates gets around in wheelchair, does not walk. DNR on chart. Pt unaware of recent change in meds/new meds.  Pt states is currently feeling improved from prior, however symptoms not resolved.   Past Medical History  Diagnosis Date  . Hypertension   . Polio   . Depression   . Cancer ([place])   . Prostate CA ([place])   . Stroke ([place])   . Gait disorder    Past Surgical History  Procedure Laterality Date  . Knee surgery     Family History  Problem Relation Age of Onset  . Heart disease Mother   . Heart disease Father   . Heart disease Grandchild    Social History  Substance Use Topics  . Smoking status: Never Smoker   . Smokeless tobacco: Never Used  . Alcohol Use: Yes     Comment: social    Review of Systems  Constitutional: Negative for fever and  chills.  HENT: Negative for sore throat and tinnitus.   Eyes: Negative for pain and visual disturbance.  Respiratory: Negative for cough and shortness of breath.   Cardiovascular: Negative for chest pain.  Gastrointestinal: Positive for nausea and vomiting. Negative for abdominal pain and diarrhea.  Genitourinary: Negative for dysuria and flank pain.  Musculoskeletal: Negative for back pain and neck pain.  Skin: Negative for rash.  Neurological: Positive for dizziness. Negative for weakness, numbness and headaches.  Hematological: Does not bruise/bleed easily.  Psychiatric/Behavioral: Negative for confusion.      Allergies  Naprosyn; Oxycontin; Penicillins; Quinine derivatives; Streptomycin; and Sulfa antibiotics  Home Medications   Prior to Admission medications   Medication Sig Start Date End Date Taking? Authorizing Provider  acetaminophen (TYLENOL) 500 MG tablet Take 500 mg by mouth every 6 (six) hours as needed for mild pain.    Historical Provider, MD  amLODipine (NORVASC) 2.5 MG tablet Take 2.5 mg by mouth daily. 12/12/14   Historical Provider, MD  carbidopa-levodopa (SINEMET IR) 25-100 MG per tablet Take 1 tablet by mouth 3 (three) times daily. At 8am, 2pm, and 8pm 09/12/12   Penni Bombard, MD  Cholecalciferol (VITAMIN D3) 2000 UNITS TABS Take 2,000 Units by mouth daily.     Historical Provider, MD  ciprofloxacin-dexamethasone (CIPRODEX) otic suspension Place 4 drops into the right ear 2 (two) times daily.    Historical Provider, MD  citalopram (CELEXA) 40 MG tablet Take  40 mg by mouth daily. 12/12/14   Historical Provider, MD  clopidogrel (PLAVIX) 75 MG tablet Take 75 mg by mouth daily.  08/23/12   Historical Provider, MD  Emollient (MOISTURIZING LOTION EX) Apply 1 application topically 2 (two) times daily.    Historical Provider, MD  ENSURE PLUS (ENSURE PLUS) LIQD Take 0.5 Cans by mouth 2 (two) times daily.    Historical Provider, MD  HYDROcodone-acetaminophen  (NORCO/VICODIN) 5-325 MG per tablet Take 1 tablet by mouth every 6 (six) hours as needed for moderate pain.    Historical Provider, MD  lisinopril (PRINIVIL,ZESTRIL) 10 MG tablet Take 10 mg by mouth daily.    Historical Provider, MD  LORazepam (ATIVAN) 0.5 MG tablet Take 0.5 mg by mouth at bedtime as needed for sleep.    Historical Provider, MD  solifenacin (VESICARE) 5 MG tablet Take 5 mg by mouth daily.    Historical Provider, MD  triamcinolone (KENALOG) 0.025 % cream Apply 1 application topically 2 (two) times daily. Apply to groin area    Historical Provider, MD   There were no vitals taken for this visit. Physical Exam  Constitutional: He is oriented to person, place, and time. He appears well-developed and well-nourished. No distress.  HENT:  Head: Atraumatic.  Mouth/Throat: Oropharynx is clear and moist.  Eyes: Conjunctivae and EOM are normal. Pupils are equal, round, and reactive to light. No scleral icterus.  Neck: Neck supple. No tracheal deviation present.  No bruit  Cardiovascular: Normal rate, regular rhythm, normal heart sounds and intact distal pulses.   Pulmonary/Chest: Effort normal and breath sounds normal. No accessory muscle usage. No respiratory distress.  Abdominal: Soft. Bowel sounds are normal. He exhibits no distension. There is no tenderness.  No puls mass.   Genitourinary:  No cva tenderness  Musculoskeletal: Normal range of motion. He exhibits no edema.  Neurological: He is alert and oriented to person, place, and time. No cranial nerve deficit.  No facial droop. Motor intact bil ext, stre 5/5. No pronator drift. Sensation grossly intact bil.  Pt indicates does not walk at baseline. No nystagmus.   Skin: Skin is warm and dry. No rash noted. He is not diaphoretic.  Psychiatric: He has a normal mood and affect.  Nursing note and vitals reviewed.   ED Course  Procedures (including critical care time) Labs Review   Results for orders placed or performed  during the hospital encounter of [id number]  Basic metabolic panel  Result Value Ref Range   Sodium 137 135 - 145 mmol/L   Potassium 4.2 3.5 - 5.1 mmol/L   Chloride 107 101 - 111 mmol/L   CO2 23 22 - 32 mmol/L   Glucose, Bld 105 (H) 65 - 99 mg/dL   BUN 23 (H) 6 - 20 mg/dL   Creatinine, Ser 1.16 0.61 - 1.24 mg/dL   Calcium 7.8 (L) 8.9 - 10.3 mg/dL   GFR calc non Af Amer 53 (L) >60 mL/min   GFR calc Af Amer >60 >60 mL/min   Anion gap 7 5 - 15  CBC  Result Value Ref Range   WBC 11.0 (H) 4.0 - 10.5 K/uL   RBC 3.82 (L) 4.22 - 5.81 MIL/uL   Hemoglobin 12.2 (L) 13.0 - 17.0 g/dL   HCT 37.2 (L) 39.0 - 52.0 %   MCV 97.4 78.0 - 100.0 fL   MCH 31.9 26.0 - 34.0 pg   MCHC 32.8 30.0 - 36.0 g/dL   RDW 13.4 11.5 - 15.5 %   Platelets  166 150 - 400 K/uL  Troponin I  Result Value Ref Range   Troponin I <0.03 <0.031 ng/mL   Ct Head Wo Contrast  02/04/2015  CLINICAL DATA:  O woke this morning with dizziness with intermittent dizziness throughout the day as well as nausea and vomiting this evening EXAM: CT HEAD WITHOUT CONTRAST TECHNIQUE: Contiguous axial images were obtained from the base of the skull through the vertex without intravenous contrast. COMPARISON:  01/05/2015 FINDINGS: Severe diffuse age-related atrophy. Diffuse low attenuation in the deep white matter particularly in the left basal ganglia and left thalamus which could represent lacunar infarcts. Abnormalities in these areas are stable when compared to prior study. No evidence of acute large vascular territory infarct. No hemorrhage or extra-axial fluid. No hydrocephalus. Calvarium intact. No significant inflammatory change in visualized portions of paranasal sinuses. IMPRESSION: Diffuse involutional change with chronic lacunar infarcts stable from 01/05/2015. No acute findings. Electronically Signed   By: Skipper Cliche M.D.   On: 02/04/2015 22:19       I have personally reviewed and evaluated these images and lab results as part of my  medical decision-making.   EKG Interpretation   Date/Time:  Monday February 04 2015 19:29:07 EST Ventricular Rate:  84 PR Interval:  191 QRS Duration: 154 QT Interval:  443 QTC Calculation: 524 R Axis:   86 Text Interpretation:  Sinus rhythm Right bundle branch block Nonspecific  ST abnormality Confirmed by Ashok Cordia  MD, Lennette Bihari ([id number]) on 02/04/2015  7:32:54 PM      MDM   Monitor. Ecg. Labs.  Will try low dose antivert for symptom improvement.   Reviewed nursing notes and prior charts for additional history.   Pt indicates feels improved. Tolerating po fluids well. Denies pain. Denies faintness/dizziness.   Ct neg acute.   Discussed diff dx w pt incl vertigo, posterior circ tia/cva  - pt indicates not amb at baseline, dnr, not candidate for aggressive intervention or anticoag - feel pt stable for d/c with   rec close pcp f/u.     Lajean Saver, MD 02/04/15 [phone]

## 2015-02-04 NOTE — ED Notes (Signed)
Pt is aware urine is needed, pt unable to urinate at this time. Urinal at bedside.

## 2015-02-04 NOTE — ED Notes (Signed)
Dr. Steinl at bedside at this time.  

## 2015-02-05 DIAGNOSIS — N393 Stress incontinence (female) (male): Secondary | ICD-10-CM | POA: Diagnosis not present

## 2015-02-05 DIAGNOSIS — M6281 Muscle weakness (generalized): Secondary | ICD-10-CM | POA: Diagnosis not present

## 2015-02-05 DIAGNOSIS — Z9181 History of falling: Secondary | ICD-10-CM | POA: Diagnosis not present

## 2015-02-05 DIAGNOSIS — R42 Dizziness and giddiness: Secondary | ICD-10-CM | POA: Diagnosis not present

## 2015-02-05 DIAGNOSIS — R278 Other lack of coordination: Secondary | ICD-10-CM | POA: Diagnosis not present

## 2015-02-05 DIAGNOSIS — R2681 Unsteadiness on feet: Secondary | ICD-10-CM | POA: Diagnosis not present

## 2015-02-05 DIAGNOSIS — R262 Difficulty in walking, not elsewhere classified: Secondary | ICD-10-CM | POA: Diagnosis not present

## 2015-02-06 DIAGNOSIS — R278 Other lack of coordination: Secondary | ICD-10-CM | POA: Diagnosis not present

## 2015-02-06 DIAGNOSIS — M6281 Muscle weakness (generalized): Secondary | ICD-10-CM | POA: Diagnosis not present

## 2015-02-06 DIAGNOSIS — R2681 Unsteadiness on feet: Secondary | ICD-10-CM | POA: Diagnosis not present

## 2015-02-06 DIAGNOSIS — Z9181 History of falling: Secondary | ICD-10-CM | POA: Diagnosis not present

## 2015-02-06 DIAGNOSIS — R262 Difficulty in walking, not elsewhere classified: Secondary | ICD-10-CM | POA: Diagnosis not present

## 2015-02-06 DIAGNOSIS — N393 Stress incontinence (female) (male): Secondary | ICD-10-CM | POA: Diagnosis not present

## 2015-02-07 DIAGNOSIS — R2681 Unsteadiness on feet: Secondary | ICD-10-CM | POA: Diagnosis not present

## 2015-02-07 DIAGNOSIS — R278 Other lack of coordination: Secondary | ICD-10-CM | POA: Diagnosis not present

## 2015-02-07 DIAGNOSIS — M6281 Muscle weakness (generalized): Secondary | ICD-10-CM | POA: Diagnosis not present

## 2015-02-07 DIAGNOSIS — Z9181 History of falling: Secondary | ICD-10-CM | POA: Diagnosis not present

## 2015-02-07 DIAGNOSIS — R262 Difficulty in walking, not elsewhere classified: Secondary | ICD-10-CM | POA: Diagnosis not present

## 2015-02-07 DIAGNOSIS — N393 Stress incontinence (female) (male): Secondary | ICD-10-CM | POA: Diagnosis not present

## 2015-02-08 DIAGNOSIS — R262 Difficulty in walking, not elsewhere classified: Secondary | ICD-10-CM | POA: Diagnosis not present

## 2015-02-08 DIAGNOSIS — M6281 Muscle weakness (generalized): Secondary | ICD-10-CM | POA: Diagnosis not present

## 2015-02-08 DIAGNOSIS — R2681 Unsteadiness on feet: Secondary | ICD-10-CM | POA: Diagnosis not present

## 2015-02-08 DIAGNOSIS — Z9181 History of falling: Secondary | ICD-10-CM | POA: Diagnosis not present

## 2015-02-08 DIAGNOSIS — R278 Other lack of coordination: Secondary | ICD-10-CM | POA: Diagnosis not present

## 2015-02-08 DIAGNOSIS — N393 Stress incontinence (female) (male): Secondary | ICD-10-CM | POA: Diagnosis not present

## 2015-02-12 DIAGNOSIS — R278 Other lack of coordination: Secondary | ICD-10-CM | POA: Diagnosis not present

## 2015-02-12 DIAGNOSIS — R262 Difficulty in walking, not elsewhere classified: Secondary | ICD-10-CM | POA: Diagnosis not present

## 2015-02-12 DIAGNOSIS — Z9181 History of falling: Secondary | ICD-10-CM | POA: Diagnosis not present

## 2015-02-12 DIAGNOSIS — M6281 Muscle weakness (generalized): Secondary | ICD-10-CM | POA: Diagnosis not present

## 2015-02-12 DIAGNOSIS — R2681 Unsteadiness on feet: Secondary | ICD-10-CM | POA: Diagnosis not present

## 2015-02-12 DIAGNOSIS — N393 Stress incontinence (female) (male): Secondary | ICD-10-CM | POA: Diagnosis not present

## 2015-02-13 DIAGNOSIS — M6281 Muscle weakness (generalized): Secondary | ICD-10-CM | POA: Diagnosis not present

## 2015-02-13 DIAGNOSIS — R278 Other lack of coordination: Secondary | ICD-10-CM | POA: Diagnosis not present

## 2015-02-13 DIAGNOSIS — R2681 Unsteadiness on feet: Secondary | ICD-10-CM | POA: Diagnosis not present

## 2015-02-13 DIAGNOSIS — R262 Difficulty in walking, not elsewhere classified: Secondary | ICD-10-CM | POA: Diagnosis not present

## 2015-02-13 DIAGNOSIS — N393 Stress incontinence (female) (male): Secondary | ICD-10-CM | POA: Diagnosis not present

## 2015-02-13 DIAGNOSIS — Z9181 History of falling: Secondary | ICD-10-CM | POA: Diagnosis not present

## 2015-02-14 DIAGNOSIS — R2681 Unsteadiness on feet: Secondary | ICD-10-CM | POA: Diagnosis not present

## 2015-02-14 DIAGNOSIS — Z9181 History of falling: Secondary | ICD-10-CM | POA: Diagnosis not present

## 2015-02-14 DIAGNOSIS — R278 Other lack of coordination: Secondary | ICD-10-CM | POA: Diagnosis not present

## 2015-02-14 DIAGNOSIS — R262 Difficulty in walking, not elsewhere classified: Secondary | ICD-10-CM | POA: Diagnosis not present

## 2015-02-14 DIAGNOSIS — N393 Stress incontinence (female) (male): Secondary | ICD-10-CM | POA: Diagnosis not present

## 2015-02-14 DIAGNOSIS — M6281 Muscle weakness (generalized): Secondary | ICD-10-CM | POA: Diagnosis not present

## 2015-02-15 DIAGNOSIS — R278 Other lack of coordination: Secondary | ICD-10-CM | POA: Diagnosis not present

## 2015-02-15 DIAGNOSIS — R2681 Unsteadiness on feet: Secondary | ICD-10-CM | POA: Diagnosis not present

## 2015-02-15 DIAGNOSIS — Z9181 History of falling: Secondary | ICD-10-CM | POA: Diagnosis not present

## 2015-02-15 DIAGNOSIS — M6281 Muscle weakness (generalized): Secondary | ICD-10-CM | POA: Diagnosis not present

## 2015-02-15 DIAGNOSIS — N393 Stress incontinence (female) (male): Secondary | ICD-10-CM | POA: Diagnosis not present

## 2015-02-15 DIAGNOSIS — R262 Difficulty in walking, not elsewhere classified: Secondary | ICD-10-CM | POA: Diagnosis not present

## 2015-02-19 DIAGNOSIS — N393 Stress incontinence (female) (male): Secondary | ICD-10-CM | POA: Diagnosis not present

## 2015-02-19 DIAGNOSIS — R2681 Unsteadiness on feet: Secondary | ICD-10-CM | POA: Diagnosis not present

## 2015-02-19 DIAGNOSIS — M6281 Muscle weakness (generalized): Secondary | ICD-10-CM | POA: Diagnosis not present

## 2015-02-19 DIAGNOSIS — R262 Difficulty in walking, not elsewhere classified: Secondary | ICD-10-CM | POA: Diagnosis not present

## 2015-02-19 DIAGNOSIS — Z9181 History of falling: Secondary | ICD-10-CM | POA: Diagnosis not present

## 2015-02-19 DIAGNOSIS — R488 Other symbolic dysfunctions: Secondary | ICD-10-CM | POA: Diagnosis not present

## 2015-02-19 DIAGNOSIS — R1319 Other dysphagia: Secondary | ICD-10-CM | POA: Diagnosis not present

## 2015-02-19 DIAGNOSIS — R278 Other lack of coordination: Secondary | ICD-10-CM | POA: Diagnosis not present

## 2015-02-20 DIAGNOSIS — R2681 Unsteadiness on feet: Secondary | ICD-10-CM | POA: Diagnosis not present

## 2015-02-20 DIAGNOSIS — M6281 Muscle weakness (generalized): Secondary | ICD-10-CM | POA: Diagnosis not present

## 2015-02-20 DIAGNOSIS — R262 Difficulty in walking, not elsewhere classified: Secondary | ICD-10-CM | POA: Diagnosis not present

## 2015-02-20 DIAGNOSIS — R278 Other lack of coordination: Secondary | ICD-10-CM | POA: Diagnosis not present

## 2015-02-20 DIAGNOSIS — N393 Stress incontinence (female) (male): Secondary | ICD-10-CM | POA: Diagnosis not present

## 2015-02-20 DIAGNOSIS — R1319 Other dysphagia: Secondary | ICD-10-CM | POA: Diagnosis not present

## 2015-02-21 DIAGNOSIS — M6281 Muscle weakness (generalized): Secondary | ICD-10-CM | POA: Diagnosis not present

## 2015-02-21 DIAGNOSIS — R278 Other lack of coordination: Secondary | ICD-10-CM | POA: Diagnosis not present

## 2015-02-21 DIAGNOSIS — R2681 Unsteadiness on feet: Secondary | ICD-10-CM | POA: Diagnosis not present

## 2015-02-21 DIAGNOSIS — R262 Difficulty in walking, not elsewhere classified: Secondary | ICD-10-CM | POA: Diagnosis not present

## 2015-02-21 DIAGNOSIS — N393 Stress incontinence (female) (male): Secondary | ICD-10-CM | POA: Diagnosis not present

## 2015-02-21 DIAGNOSIS — R1319 Other dysphagia: Secondary | ICD-10-CM | POA: Diagnosis not present

## 2015-02-22 DIAGNOSIS — R262 Difficulty in walking, not elsewhere classified: Secondary | ICD-10-CM | POA: Diagnosis not present

## 2015-02-22 DIAGNOSIS — N393 Stress incontinence (female) (male): Secondary | ICD-10-CM | POA: Diagnosis not present

## 2015-02-22 DIAGNOSIS — M6281 Muscle weakness (generalized): Secondary | ICD-10-CM | POA: Diagnosis not present

## 2015-02-22 DIAGNOSIS — R278 Other lack of coordination: Secondary | ICD-10-CM | POA: Diagnosis not present

## 2015-02-22 DIAGNOSIS — R2681 Unsteadiness on feet: Secondary | ICD-10-CM | POA: Diagnosis not present

## 2015-02-22 DIAGNOSIS — R1319 Other dysphagia: Secondary | ICD-10-CM | POA: Diagnosis not present

## 2015-02-25 DIAGNOSIS — N393 Stress incontinence (female) (male): Secondary | ICD-10-CM | POA: Diagnosis not present

## 2015-02-25 DIAGNOSIS — R2681 Unsteadiness on feet: Secondary | ICD-10-CM | POA: Diagnosis not present

## 2015-02-25 DIAGNOSIS — R1319 Other dysphagia: Secondary | ICD-10-CM | POA: Diagnosis not present

## 2015-02-25 DIAGNOSIS — M6281 Muscle weakness (generalized): Secondary | ICD-10-CM | POA: Diagnosis not present

## 2015-02-25 DIAGNOSIS — R278 Other lack of coordination: Secondary | ICD-10-CM | POA: Diagnosis not present

## 2015-02-25 DIAGNOSIS — R262 Difficulty in walking, not elsewhere classified: Secondary | ICD-10-CM | POA: Diagnosis not present

## 2015-02-26 DIAGNOSIS — R2681 Unsteadiness on feet: Secondary | ICD-10-CM | POA: Diagnosis not present

## 2015-02-26 DIAGNOSIS — R262 Difficulty in walking, not elsewhere classified: Secondary | ICD-10-CM | POA: Diagnosis not present

## 2015-02-26 DIAGNOSIS — N393 Stress incontinence (female) (male): Secondary | ICD-10-CM | POA: Diagnosis not present

## 2015-02-26 DIAGNOSIS — R1319 Other dysphagia: Secondary | ICD-10-CM | POA: Diagnosis not present

## 2015-02-26 DIAGNOSIS — R278 Other lack of coordination: Secondary | ICD-10-CM | POA: Diagnosis not present

## 2015-02-26 DIAGNOSIS — M6281 Muscle weakness (generalized): Secondary | ICD-10-CM | POA: Diagnosis not present

## 2015-02-27 DIAGNOSIS — N393 Stress incontinence (female) (male): Secondary | ICD-10-CM | POA: Diagnosis not present

## 2015-02-27 DIAGNOSIS — M6281 Muscle weakness (generalized): Secondary | ICD-10-CM | POA: Diagnosis not present

## 2015-02-27 DIAGNOSIS — R278 Other lack of coordination: Secondary | ICD-10-CM | POA: Diagnosis not present

## 2015-02-27 DIAGNOSIS — R262 Difficulty in walking, not elsewhere classified: Secondary | ICD-10-CM | POA: Diagnosis not present

## 2015-02-27 DIAGNOSIS — R2681 Unsteadiness on feet: Secondary | ICD-10-CM | POA: Diagnosis not present

## 2015-02-27 DIAGNOSIS — R1319 Other dysphagia: Secondary | ICD-10-CM | POA: Diagnosis not present

## 2015-02-28 DIAGNOSIS — N393 Stress incontinence (female) (male): Secondary | ICD-10-CM | POA: Diagnosis not present

## 2015-02-28 DIAGNOSIS — R1319 Other dysphagia: Secondary | ICD-10-CM | POA: Diagnosis not present

## 2015-02-28 DIAGNOSIS — R2681 Unsteadiness on feet: Secondary | ICD-10-CM | POA: Diagnosis not present

## 2015-02-28 DIAGNOSIS — M6281 Muscle weakness (generalized): Secondary | ICD-10-CM | POA: Diagnosis not present

## 2015-02-28 DIAGNOSIS — R262 Difficulty in walking, not elsewhere classified: Secondary | ICD-10-CM | POA: Diagnosis not present

## 2015-02-28 DIAGNOSIS — R278 Other lack of coordination: Secondary | ICD-10-CM | POA: Diagnosis not present

## 2015-03-01 DIAGNOSIS — M6281 Muscle weakness (generalized): Secondary | ICD-10-CM | POA: Diagnosis not present

## 2015-03-01 DIAGNOSIS — R278 Other lack of coordination: Secondary | ICD-10-CM | POA: Diagnosis not present

## 2015-03-01 DIAGNOSIS — R2681 Unsteadiness on feet: Secondary | ICD-10-CM | POA: Diagnosis not present

## 2015-03-01 DIAGNOSIS — R1319 Other dysphagia: Secondary | ICD-10-CM | POA: Diagnosis not present

## 2015-03-01 DIAGNOSIS — R262 Difficulty in walking, not elsewhere classified: Secondary | ICD-10-CM | POA: Diagnosis not present

## 2015-03-01 DIAGNOSIS — N393 Stress incontinence (female) (male): Secondary | ICD-10-CM | POA: Diagnosis not present

## 2015-03-04 DIAGNOSIS — N393 Stress incontinence (female) (male): Secondary | ICD-10-CM | POA: Diagnosis not present

## 2015-03-04 DIAGNOSIS — M6281 Muscle weakness (generalized): Secondary | ICD-10-CM | POA: Diagnosis not present

## 2015-03-04 DIAGNOSIS — R2681 Unsteadiness on feet: Secondary | ICD-10-CM | POA: Diagnosis not present

## 2015-03-04 DIAGNOSIS — R1319 Other dysphagia: Secondary | ICD-10-CM | POA: Diagnosis not present

## 2015-03-04 DIAGNOSIS — R262 Difficulty in walking, not elsewhere classified: Secondary | ICD-10-CM | POA: Diagnosis not present

## 2015-03-04 DIAGNOSIS — R278 Other lack of coordination: Secondary | ICD-10-CM | POA: Diagnosis not present

## 2015-03-05 DIAGNOSIS — R2681 Unsteadiness on feet: Secondary | ICD-10-CM | POA: Diagnosis not present

## 2015-03-05 DIAGNOSIS — M6281 Muscle weakness (generalized): Secondary | ICD-10-CM | POA: Diagnosis not present

## 2015-03-05 DIAGNOSIS — R262 Difficulty in walking, not elsewhere classified: Secondary | ICD-10-CM | POA: Diagnosis not present

## 2015-03-05 DIAGNOSIS — N393 Stress incontinence (female) (male): Secondary | ICD-10-CM | POA: Diagnosis not present

## 2015-03-05 DIAGNOSIS — R278 Other lack of coordination: Secondary | ICD-10-CM | POA: Diagnosis not present

## 2015-03-05 DIAGNOSIS — R1319 Other dysphagia: Secondary | ICD-10-CM | POA: Diagnosis not present

## 2015-03-07 DIAGNOSIS — M6281 Muscle weakness (generalized): Secondary | ICD-10-CM | POA: Diagnosis not present

## 2015-03-07 DIAGNOSIS — R262 Difficulty in walking, not elsewhere classified: Secondary | ICD-10-CM | POA: Diagnosis not present

## 2015-03-07 DIAGNOSIS — R278 Other lack of coordination: Secondary | ICD-10-CM | POA: Diagnosis not present

## 2015-03-07 DIAGNOSIS — R2681 Unsteadiness on feet: Secondary | ICD-10-CM | POA: Diagnosis not present

## 2015-03-07 DIAGNOSIS — N393 Stress incontinence (female) (male): Secondary | ICD-10-CM | POA: Diagnosis not present

## 2015-03-07 DIAGNOSIS — R1319 Other dysphagia: Secondary | ICD-10-CM | POA: Diagnosis not present

## 2015-03-08 DIAGNOSIS — H8110 Benign paroxysmal vertigo, unspecified ear: Secondary | ICD-10-CM | POA: Diagnosis not present

## 2015-03-08 DIAGNOSIS — I129 Hypertensive chronic kidney disease with stage 1 through stage 4 chronic kidney disease, or unspecified chronic kidney disease: Secondary | ICD-10-CM | POA: Diagnosis not present

## 2015-03-08 DIAGNOSIS — M25511 Pain in right shoulder: Secondary | ICD-10-CM | POA: Diagnosis not present

## 2015-03-08 DIAGNOSIS — Z79899 Other long term (current) drug therapy: Secondary | ICD-10-CM | POA: Diagnosis not present

## 2015-03-08 DIAGNOSIS — N183 Chronic kidney disease, stage 3 (moderate): Secondary | ICD-10-CM | POA: Diagnosis not present

## 2015-03-11 DIAGNOSIS — R278 Other lack of coordination: Secondary | ICD-10-CM | POA: Diagnosis not present

## 2015-03-11 DIAGNOSIS — R2681 Unsteadiness on feet: Secondary | ICD-10-CM | POA: Diagnosis not present

## 2015-03-11 DIAGNOSIS — M6281 Muscle weakness (generalized): Secondary | ICD-10-CM | POA: Diagnosis not present

## 2015-03-11 DIAGNOSIS — N393 Stress incontinence (female) (male): Secondary | ICD-10-CM | POA: Diagnosis not present

## 2015-03-11 DIAGNOSIS — R262 Difficulty in walking, not elsewhere classified: Secondary | ICD-10-CM | POA: Diagnosis not present

## 2015-03-11 DIAGNOSIS — R1319 Other dysphagia: Secondary | ICD-10-CM | POA: Diagnosis not present

## 2015-03-13 DIAGNOSIS — R278 Other lack of coordination: Secondary | ICD-10-CM | POA: Diagnosis not present

## 2015-03-13 DIAGNOSIS — N393 Stress incontinence (female) (male): Secondary | ICD-10-CM | POA: Diagnosis not present

## 2015-03-13 DIAGNOSIS — M6281 Muscle weakness (generalized): Secondary | ICD-10-CM | POA: Diagnosis not present

## 2015-03-13 DIAGNOSIS — R2681 Unsteadiness on feet: Secondary | ICD-10-CM | POA: Diagnosis not present

## 2015-03-13 DIAGNOSIS — R262 Difficulty in walking, not elsewhere classified: Secondary | ICD-10-CM | POA: Diagnosis not present

## 2015-03-13 DIAGNOSIS — R1319 Other dysphagia: Secondary | ICD-10-CM | POA: Diagnosis not present

## 2015-05-30 DIAGNOSIS — S61422A Laceration with foreign body of left hand, initial encounter: Secondary | ICD-10-CM | POA: Diagnosis not present

## 2015-06-20 DIAGNOSIS — F324 Major depressive disorder, single episode, in partial remission: Secondary | ICD-10-CM | POA: Diagnosis not present

## 2015-07-22 DIAGNOSIS — R531 Weakness: Secondary | ICD-10-CM | POA: Diagnosis not present

## 2015-09-25 DIAGNOSIS — R531 Weakness: Secondary | ICD-10-CM | POA: Diagnosis not present

## 2015-09-30 DIAGNOSIS — F329 Major depressive disorder, single episode, unspecified: Secondary | ICD-10-CM | POA: Diagnosis not present

## 2015-10-08 DIAGNOSIS — F329 Major depressive disorder, single episode, unspecified: Secondary | ICD-10-CM | POA: Diagnosis not present

## 2015-10-13 ENCOUNTER — Encounter (HOSPITAL_COMMUNITY): Payer: Self-pay | Admitting: Emergency Medicine

## 2015-10-13 ENCOUNTER — Emergency Department (HOSPITAL_COMMUNITY)
Admission: EM | Admit: 2015-10-13 | Discharge: 2015-10-13 | Disposition: A | Discharge status: Home / Self Care | Payer: Medicare Other | Attending: Emergency Medicine | Admitting: Emergency Medicine

## 2015-10-13 DIAGNOSIS — N289 Disorder of kidney and ureter, unspecified: Secondary | ICD-10-CM | POA: Diagnosis not present

## 2015-10-13 DIAGNOSIS — R259 Unspecified abnormal involuntary movements: Secondary | ICD-10-CM | POA: Diagnosis not present

## 2015-10-13 DIAGNOSIS — Z23 Encounter for immunization: Secondary | ICD-10-CM | POA: Diagnosis not present

## 2015-10-13 DIAGNOSIS — Y939 Activity, unspecified: Secondary | ICD-10-CM | POA: Insufficient documentation

## 2015-10-13 DIAGNOSIS — Z8673 Personal history of transient ischemic attack (TIA), and cerebral infarction without residual deficits: Secondary | ICD-10-CM | POA: Insufficient documentation

## 2015-10-13 DIAGNOSIS — G20 Parkinson's disease: Secondary | ICD-10-CM | POA: Diagnosis not present

## 2015-10-13 DIAGNOSIS — W19XXXA Unspecified fall, initial encounter: Secondary | ICD-10-CM | POA: Diagnosis not present

## 2015-10-13 DIAGNOSIS — Z8546 Personal history of malignant neoplasm of prostate: Secondary | ICD-10-CM | POA: Diagnosis not present

## 2015-10-13 DIAGNOSIS — Y999 Unspecified external cause status: Secondary | ICD-10-CM | POA: Diagnosis not present

## 2015-10-13 DIAGNOSIS — Y929 Unspecified place or not applicable: Secondary | ICD-10-CM | POA: Diagnosis not present

## 2015-10-13 DIAGNOSIS — S80212A Abrasion, left knee, initial encounter: Secondary | ICD-10-CM | POA: Diagnosis not present

## 2015-10-13 DIAGNOSIS — I10 Essential (primary) hypertension: Secondary | ICD-10-CM | POA: Insufficient documentation

## 2015-10-13 DIAGNOSIS — S6980XA Other specified injuries of unspecified wrist, hand and finger(s), initial encounter: Secondary | ICD-10-CM | POA: Diagnosis not present

## 2015-10-13 DIAGNOSIS — S8992XA Unspecified injury of left lower leg, initial encounter: Secondary | ICD-10-CM | POA: Diagnosis present

## 2015-10-13 LAB — I-STAT CHEM 8, ED
BUN: 38 mg/dL — ABNORMAL HIGH (ref 6–20)
Calcium, Ion: 1.17 mmol/L (ref 1.12–1.23)
Chloride: 103 mmol/L (ref 101–111)
Creatinine, Ser: 1.4 mg/dL — ABNORMAL HIGH (ref 0.61–1.24)
GLUCOSE: 90 mg/dL (ref 65–99)
HCT: 39 % (ref 39.0–52.0)
HEMOGLOBIN: 13.3 g/dL (ref 13.0–17.0)
POTASSIUM: 4.9 mmol/L (ref 3.5–5.1)
SODIUM: 139 mmol/L (ref 135–145)
TCO2: 27 mmol/L (ref 0–100)

## 2015-10-13 LAB — CBC WITH DIFFERENTIAL/PLATELET
BASOS PCT: 0 %
Basophils Absolute: 0 10*3/uL (ref 0.0–0.1)
Eosinophils Absolute: 0.3 10*3/uL (ref 0.0–0.7)
Eosinophils Relative: 3 %
HEMATOCRIT: 40.3 % (ref 39.0–52.0)
Hemoglobin: 13.1 g/dL (ref 13.0–17.0)
LYMPHS PCT: 8 %
Lymphs Abs: 0.7 10*3/uL (ref 0.7–4.0)
MCH: 33.1 pg (ref 26.0–34.0)
MCHC: 32.5 g/dL (ref 30.0–36.0)
MCV: 101.8 fL — ABNORMAL HIGH (ref 78.0–100.0)
MONO ABS: 0.6 10*3/uL (ref 0.1–1.0)
Monocytes Relative: 6 %
Neutro Abs: 7.7 10*3/uL (ref 1.7–7.7)
Neutrophils Relative %: 83 %
Platelets: 206 10*3/uL (ref 150–400)
RBC: 3.96 MIL/uL — ABNORMAL LOW (ref 4.22–5.81)
RDW: 12.7 % (ref 11.5–15.5)
WBC: 9.3 10*3/uL (ref 4.0–10.5)

## 2015-10-13 MED ORDER — TETANUS-DIPHTH-ACELL PERTUSSIS 5-2.5-18.5 LF-MCG/0.5 IM SUSP
0.5000 mL | Freq: Once | INTRAMUSCULAR | Status: AC
  Administered 2015-10-13: 0.5 mL via INTRAMUSCULAR
  Filled 2015-10-13: qty 0.5

## 2015-10-13 NOTE — ED Notes (Addendum)
Called lab to check on status of CBC. Lab states "not received."

## 2015-10-13 NOTE — ED Provider Notes (Addendum)
[facility] DEPT Provider Note   CSN: [id number] Arrival date & time: 10/13/15  0825     History   Chief Complaint Chief Complaint  Patient presents with  . Fall   Level V caveat dementia. History is obtained from paperwork accompanying patient and from [facility] HPI Ricardo [name] is a 82 y.o. male.HPI patient fell 7:45 AM today. He states he did not lose consciousness. He offers no complaint denies pain anywhere. No other associated symptoms. No treatment prior to coming here.  Past Medical History:  Diagnosis Date  . Cancer ([place])   . Depression   . Gait disorder   . Hypertension   . Polio   . Prostate CA ([place])   . Stroke [facility])     Patient Active Problem List   Diagnosis Date Noted  . Gait difficulty 09/12/2012  . Parkinsonism ([place]) 09/12/2012    Past Surgical History:  Procedure Laterality Date  . KNEE SURGERY         Home Medications    Prior to Admission medications   Medication Sig Start Date End Date Taking? Authorizing Provider  acetaminophen (TYLENOL) 500 MG tablet Take 500 mg by mouth every 6 (six) hours as needed for mild pain.    Historical Provider, MD  amLODipine (NORVASC) 2.5 MG tablet Take 2.5 mg by mouth daily. 12/12/14   Historical Provider, MD  carbidopa-levodopa (SINEMET IR) 25-100 MG per tablet Take 1 tablet by mouth 3 (three) times daily. At 8am, 2pm, and 8pm 09/12/12   Penni Bombard, MD  Cholecalciferol (VITAMIN D3) 2000 UNITS TABS Take 2,000 Units by mouth daily.     Historical Provider, MD  citalopram (CELEXA) 40 MG tablet Take 40 mg by mouth daily. 12/12/14   Historical Provider, MD  clopidogrel (PLAVIX) 75 MG tablet Take 75 mg by mouth daily.  08/23/12   Historical Provider, MD  Emollient (MOISTURIZING LOTION EX) Apply 1 application topically 2 (two) times daily.    Historical Provider, MD  ENSURE PLUS (ENSURE PLUS) LIQD Take 0.5 Cans by mouth 2 (two) times daily.    Historical Provider, MD  HYDROcodone-acetaminophen  (NORCO/VICODIN) 5-325 MG per tablet Take 1 tablet by mouth every 6 (six) hours as needed for moderate pain.    Historical Provider, MD  lisinopril (PRINIVIL,ZESTRIL) 10 MG tablet Take 10 mg by mouth daily.    Historical Provider, MD  LORazepam (ATIVAN) 0.5 MG tablet Take 0.5 mg by mouth See admin instructions. Takes 1 tab as needed for anxiety throughout the day and takes 1 tab every night for sleep    Historical Provider, MD  solifenacin (VESICARE) 5 MG tablet Take 5 mg by mouth daily.    Historical Provider, MD  triamcinolone (KENALOG) 0.025 % cream Apply 1 application topically 2 (two) times daily. Apply to groin area    Historical Provider, MD    Family History Family History  Problem Relation Age of Onset  . Heart disease Mother   . Heart disease Father   . Heart disease Grandchild     Social History Social History  Substance Use Topics  . Smoking status: Never Smoker  . Smokeless tobacco: Never Used  . Alcohol use Yes     Comment: social     Allergies   Naprosyn [naproxen]; Oxycontin [oxycodone]; Penicillins; Quinine derivatives; Streptomycin; and Sulfa antibiotics   Review of Systems Review of Systems  Unable to perform ROS: Dementia  Musculoskeletal: Positive for gait problem.     Physical Exam Updated Vital Signs BP  117/55   Pulse 79   Temp 97.7 F (36.5 C) (Oral)   Resp 22   SpO2 94%   Physical Exam  Constitutional:  Frail-appearing  HENT:  Head: Normocephalic and atraumatic.  Eyes: Conjunctivae are normal. Pupils are equal, round, and reactive to light.  Neck: Neck supple. No tracheal deviation present. No thyromegaly present.  Cardiovascular: Normal rate and regular rhythm.   No murmur heard. Pulmonary/Chest: Effort normal and breath sounds normal.  Abdominal: Soft. Bowel sounds are normal. He exhibits no distension. There is no tenderness.  Musculoskeletal: Normal range of motion. He exhibits no edema or tenderness.  Neurological: He is alert.  Coordination normal.  Oriented to name and hospital. Cranial nerves II through XII grossly intact. Motor strength 5 over 5 overall entire spine nontender. Pelvis stable nontender.  Skin: Skin is warm and dry. No rash noted.  Dime-sized abrasion at lateral aspect of left knee.  Psychiatric: He has a normal mood and affect. His behavior is normal.  Nursing note and vitals reviewed.    ED Treatments / Results  Labs (all labs ordered are listed, but only abnormal results are displayed) Labs Reviewed  CBC WITH DIFFERENTIAL/PLATELET  I-STAT CHEM 8, ED    EKG  EKG Interpretation None       Radiology No results found.  Procedures Procedures (including critical care time)  Medications Ordered in ED Medications - No data to display   Initial Impression / Assessment and Plan / ED Course  I have reviewed the triage vital signs and the nursing notes.  Pertinent labs & imaging results that were available during my care of the patient were reviewed by me and considered in my medical decision making (see chart for details).  Clinical Course   Results for orders placed or performed during the hospital encounter of 10/13/15  CBC with Differential/Platelet  Result Value Ref Range   WBC 9.3 4.0 - 10.5 K/uL   RBC 3.96 (L) 4.22 - 5.81 MIL/uL   Hemoglobin 13.1 13.0 - 17.0 g/dL   HCT 40.3 39.0 - 52.0 %   MCV 101.8 (H) 78.0 - 100.0 fL   MCH 33.1 26.0 - 34.0 pg   MCHC 32.5 30.0 - 36.0 g/dL   RDW 12.7 11.5 - 15.5 %   Platelets 206 150 - 400 K/uL   Neutrophils Relative % 83 %   Neutro Abs 7.7 1.7 - 7.7 K/uL   Lymphocytes Relative 8 %   Lymphs Abs 0.7 0.7 - 4.0 K/uL   Monocytes Relative 6 %   Monocytes Absolute 0.6 0.1 - 1.0 K/uL   Eosinophils Relative 3 %   Eosinophils Absolute 0.3 0.0 - 0.7 K/uL   Basophils Relative 0 %   Basophils Absolute 0.0 0.0 - 0.1 K/uL  I-stat chem 8, ed  Result Value Ref Range   Sodium 139 135 - 145 mmol/L   Potassium 4.9 3.5 - 5.1 mmol/L   Chloride  103 101 - 111 mmol/L   BUN 38 (H) 6 - 20 mg/dL   Creatinine, Ser 1.40 (H) 0.61 - 1.24 mg/dL   Glucose, Bld 90 65 - 99 mg/dL   Calcium, Ion 1.17 1.12 - 1.23 mmol/L   TCO2 27 0 - 100 mmol/L   Hemoglobin 13.3 13.0 - 17.0 g/dL   HCT 39.0 39.0 - 52.0 %   No results found. 10:10 AM patient resting comfortably. Looks like his normal self according to his son who is here with him. Plan return to assisted-living facility. Tdap administered  Final Clinical Impressions(s) / ED Diagnoses  Diagnosis #1 fall #2 mild renal insufficiency #3 abrasion to left knee Final diagnoses:  None    New Prescriptions New Prescriptions   No medications on file     Orlie Dakin, MD 10/13/15 [place], MD 10/13/15 1113

## 2015-10-13 NOTE — ED Notes (Signed)
[facility] contacted to transport patient to [facility]

## 2015-10-13 NOTE — Discharge Instructions (Signed)
Wash wound on left knee daily with soap and water and place a thin layer of bacitracin ointment over the wound, and cover with sterile bandage. Signs of infection including redness around the wound, more pain, drainage from the wound or fever. Return if concern for any reason or call Dr. Felipa Eth.Tdap was administered today

## 2015-10-13 NOTE — ED Triage Notes (Addendum)
Per [facility]- pt here from [facility] with c.o. Fall. Pt had unwitnessed fall sometime last night, found on floor. Unknown how long he was on the floor. Pt denies pain anywhere. Pt oriented to situation and self, disoriented to time. Pt aware he fell and able to report his family is in town visiting. Pt has skin tear to right hand in between index and middle finger. Pt also has skin tear to left side of left calf. Pt is at memory  Baseline per facility. Pt unaware if he hit his head or not. Denies headache.

## 2015-10-17 DIAGNOSIS — F329 Major depressive disorder, single episode, unspecified: Secondary | ICD-10-CM | POA: Diagnosis not present

## 2015-10-30 DIAGNOSIS — F329 Major depressive disorder, single episode, unspecified: Secondary | ICD-10-CM | POA: Diagnosis not present

## 2015-11-26 DIAGNOSIS — F329 Major depressive disorder, single episode, unspecified: Secondary | ICD-10-CM | POA: Diagnosis not present

## 2015-11-29 DIAGNOSIS — F329 Major depressive disorder, single episode, unspecified: Secondary | ICD-10-CM | POA: Diagnosis not present

## 2015-12-25 DIAGNOSIS — M25511 Pain in right shoulder: Secondary | ICD-10-CM | POA: Diagnosis not present

## 2016-03-05 DIAGNOSIS — F329 Major depressive disorder, single episode, unspecified: Secondary | ICD-10-CM | POA: Diagnosis not present

## 2016-04-07 DIAGNOSIS — R2681 Unsteadiness on feet: Secondary | ICD-10-CM | POA: Diagnosis not present

## 2016-04-07 DIAGNOSIS — R296 Repeated falls: Secondary | ICD-10-CM | POA: Diagnosis not present

## 2016-04-07 DIAGNOSIS — R278 Other lack of coordination: Secondary | ICD-10-CM | POA: Diagnosis not present

## 2016-04-07 DIAGNOSIS — M6281 Muscle weakness (generalized): Secondary | ICD-10-CM | POA: Diagnosis not present

## 2016-04-07 DIAGNOSIS — R262 Difficulty in walking, not elsewhere classified: Secondary | ICD-10-CM | POA: Diagnosis not present

## 2016-04-08 DIAGNOSIS — R278 Other lack of coordination: Secondary | ICD-10-CM | POA: Diagnosis not present

## 2016-04-08 DIAGNOSIS — R296 Repeated falls: Secondary | ICD-10-CM | POA: Diagnosis not present

## 2016-04-08 DIAGNOSIS — R262 Difficulty in walking, not elsewhere classified: Secondary | ICD-10-CM | POA: Diagnosis not present

## 2016-04-08 DIAGNOSIS — R2681 Unsteadiness on feet: Secondary | ICD-10-CM | POA: Diagnosis not present

## 2016-04-08 DIAGNOSIS — M6281 Muscle weakness (generalized): Secondary | ICD-10-CM | POA: Diagnosis not present

## 2016-04-10 DIAGNOSIS — R531 Weakness: Secondary | ICD-10-CM | POA: Diagnosis not present

## 2016-04-10 DIAGNOSIS — R2681 Unsteadiness on feet: Secondary | ICD-10-CM | POA: Diagnosis not present

## 2016-04-10 DIAGNOSIS — R278 Other lack of coordination: Secondary | ICD-10-CM | POA: Diagnosis not present

## 2016-04-10 DIAGNOSIS — M6281 Muscle weakness (generalized): Secondary | ICD-10-CM | POA: Diagnosis not present

## 2016-04-10 DIAGNOSIS — R296 Repeated falls: Secondary | ICD-10-CM | POA: Diagnosis not present

## 2016-04-10 DIAGNOSIS — R262 Difficulty in walking, not elsewhere classified: Secondary | ICD-10-CM | POA: Diagnosis not present

## 2016-04-14 DIAGNOSIS — R278 Other lack of coordination: Secondary | ICD-10-CM | POA: Diagnosis not present

## 2016-04-14 DIAGNOSIS — R262 Difficulty in walking, not elsewhere classified: Secondary | ICD-10-CM | POA: Diagnosis not present

## 2016-04-14 DIAGNOSIS — R2681 Unsteadiness on feet: Secondary | ICD-10-CM | POA: Diagnosis not present

## 2016-04-14 DIAGNOSIS — R296 Repeated falls: Secondary | ICD-10-CM | POA: Diagnosis not present

## 2016-04-14 DIAGNOSIS — M6281 Muscle weakness (generalized): Secondary | ICD-10-CM | POA: Diagnosis not present

## 2016-04-17 DIAGNOSIS — R296 Repeated falls: Secondary | ICD-10-CM | POA: Diagnosis not present

## 2016-04-17 DIAGNOSIS — R2681 Unsteadiness on feet: Secondary | ICD-10-CM | POA: Diagnosis not present

## 2016-04-17 DIAGNOSIS — M6281 Muscle weakness (generalized): Secondary | ICD-10-CM | POA: Diagnosis not present

## 2016-04-17 DIAGNOSIS — R262 Difficulty in walking, not elsewhere classified: Secondary | ICD-10-CM | POA: Diagnosis not present

## 2016-04-17 DIAGNOSIS — R278 Other lack of coordination: Secondary | ICD-10-CM | POA: Diagnosis not present

## 2016-04-21 DIAGNOSIS — R278 Other lack of coordination: Secondary | ICD-10-CM | POA: Diagnosis not present

## 2016-04-21 DIAGNOSIS — M6281 Muscle weakness (generalized): Secondary | ICD-10-CM | POA: Diagnosis not present

## 2016-04-21 DIAGNOSIS — R296 Repeated falls: Secondary | ICD-10-CM | POA: Diagnosis not present

## 2016-04-21 DIAGNOSIS — R2681 Unsteadiness on feet: Secondary | ICD-10-CM | POA: Diagnosis not present

## 2016-04-21 DIAGNOSIS — R262 Difficulty in walking, not elsewhere classified: Secondary | ICD-10-CM | POA: Diagnosis not present

## 2016-04-22 DIAGNOSIS — M6281 Muscle weakness (generalized): Secondary | ICD-10-CM | POA: Diagnosis not present

## 2016-04-22 DIAGNOSIS — R262 Difficulty in walking, not elsewhere classified: Secondary | ICD-10-CM | POA: Diagnosis not present

## 2016-04-22 DIAGNOSIS — R2681 Unsteadiness on feet: Secondary | ICD-10-CM | POA: Diagnosis not present

## 2016-04-22 DIAGNOSIS — R278 Other lack of coordination: Secondary | ICD-10-CM | POA: Diagnosis not present

## 2016-04-22 DIAGNOSIS — R296 Repeated falls: Secondary | ICD-10-CM | POA: Diagnosis not present

## 2016-04-23 DIAGNOSIS — R278 Other lack of coordination: Secondary | ICD-10-CM | POA: Diagnosis not present

## 2016-04-23 DIAGNOSIS — R2681 Unsteadiness on feet: Secondary | ICD-10-CM | POA: Diagnosis not present

## 2016-04-23 DIAGNOSIS — R296 Repeated falls: Secondary | ICD-10-CM | POA: Diagnosis not present

## 2016-04-23 DIAGNOSIS — R262 Difficulty in walking, not elsewhere classified: Secondary | ICD-10-CM | POA: Diagnosis not present

## 2016-04-23 DIAGNOSIS — M6281 Muscle weakness (generalized): Secondary | ICD-10-CM | POA: Diagnosis not present

## 2016-04-29 DIAGNOSIS — R278 Other lack of coordination: Secondary | ICD-10-CM | POA: Diagnosis not present

## 2016-04-29 DIAGNOSIS — R2681 Unsteadiness on feet: Secondary | ICD-10-CM | POA: Diagnosis not present

## 2016-04-29 DIAGNOSIS — M6281 Muscle weakness (generalized): Secondary | ICD-10-CM | POA: Diagnosis not present

## 2016-04-29 DIAGNOSIS — R262 Difficulty in walking, not elsewhere classified: Secondary | ICD-10-CM | POA: Diagnosis not present

## 2016-04-29 DIAGNOSIS — R296 Repeated falls: Secondary | ICD-10-CM | POA: Diagnosis not present

## 2016-05-01 DIAGNOSIS — R2681 Unsteadiness on feet: Secondary | ICD-10-CM | POA: Diagnosis not present

## 2016-05-01 DIAGNOSIS — M6281 Muscle weakness (generalized): Secondary | ICD-10-CM | POA: Diagnosis not present

## 2016-05-01 DIAGNOSIS — R296 Repeated falls: Secondary | ICD-10-CM | POA: Diagnosis not present

## 2016-05-01 DIAGNOSIS — R278 Other lack of coordination: Secondary | ICD-10-CM | POA: Diagnosis not present

## 2016-05-01 DIAGNOSIS — R262 Difficulty in walking, not elsewhere classified: Secondary | ICD-10-CM | POA: Diagnosis not present

## 2016-05-04 DIAGNOSIS — R296 Repeated falls: Secondary | ICD-10-CM | POA: Diagnosis not present

## 2016-05-04 DIAGNOSIS — R278 Other lack of coordination: Secondary | ICD-10-CM | POA: Diagnosis not present

## 2016-05-04 DIAGNOSIS — M6281 Muscle weakness (generalized): Secondary | ICD-10-CM | POA: Diagnosis not present

## 2016-05-04 DIAGNOSIS — R262 Difficulty in walking, not elsewhere classified: Secondary | ICD-10-CM | POA: Diagnosis not present

## 2016-05-04 DIAGNOSIS — R2681 Unsteadiness on feet: Secondary | ICD-10-CM | POA: Diagnosis not present

## 2016-05-06 DIAGNOSIS — R296 Repeated falls: Secondary | ICD-10-CM | POA: Diagnosis not present

## 2016-05-06 DIAGNOSIS — M6281 Muscle weakness (generalized): Secondary | ICD-10-CM | POA: Diagnosis not present

## 2016-05-06 DIAGNOSIS — R262 Difficulty in walking, not elsewhere classified: Secondary | ICD-10-CM | POA: Diagnosis not present

## 2016-05-06 DIAGNOSIS — R2681 Unsteadiness on feet: Secondary | ICD-10-CM | POA: Diagnosis not present

## 2016-05-06 DIAGNOSIS — R278 Other lack of coordination: Secondary | ICD-10-CM | POA: Diagnosis not present

## 2016-05-08 DIAGNOSIS — M6281 Muscle weakness (generalized): Secondary | ICD-10-CM | POA: Diagnosis not present

## 2016-05-08 DIAGNOSIS — R262 Difficulty in walking, not elsewhere classified: Secondary | ICD-10-CM | POA: Diagnosis not present

## 2016-05-08 DIAGNOSIS — R2681 Unsteadiness on feet: Secondary | ICD-10-CM | POA: Diagnosis not present

## 2016-05-08 DIAGNOSIS — R296 Repeated falls: Secondary | ICD-10-CM | POA: Diagnosis not present

## 2016-05-08 DIAGNOSIS — R278 Other lack of coordination: Secondary | ICD-10-CM | POA: Diagnosis not present

## 2016-05-11 DIAGNOSIS — R296 Repeated falls: Secondary | ICD-10-CM | POA: Diagnosis not present

## 2016-05-11 DIAGNOSIS — R278 Other lack of coordination: Secondary | ICD-10-CM | POA: Diagnosis not present

## 2016-05-11 DIAGNOSIS — M6281 Muscle weakness (generalized): Secondary | ICD-10-CM | POA: Diagnosis not present

## 2016-05-11 DIAGNOSIS — R262 Difficulty in walking, not elsewhere classified: Secondary | ICD-10-CM | POA: Diagnosis not present

## 2016-05-11 DIAGNOSIS — R2681 Unsteadiness on feet: Secondary | ICD-10-CM | POA: Diagnosis not present

## 2016-05-13 DIAGNOSIS — R296 Repeated falls: Secondary | ICD-10-CM | POA: Diagnosis not present

## 2016-05-13 DIAGNOSIS — R278 Other lack of coordination: Secondary | ICD-10-CM | POA: Diagnosis not present

## 2016-05-13 DIAGNOSIS — R2681 Unsteadiness on feet: Secondary | ICD-10-CM | POA: Diagnosis not present

## 2016-05-13 DIAGNOSIS — M6281 Muscle weakness (generalized): Secondary | ICD-10-CM | POA: Diagnosis not present

## 2016-05-13 DIAGNOSIS — R262 Difficulty in walking, not elsewhere classified: Secondary | ICD-10-CM | POA: Diagnosis not present

## 2016-05-15 DIAGNOSIS — M6281 Muscle weakness (generalized): Secondary | ICD-10-CM | POA: Diagnosis not present

## 2016-05-15 DIAGNOSIS — R2681 Unsteadiness on feet: Secondary | ICD-10-CM | POA: Diagnosis not present

## 2016-05-15 DIAGNOSIS — R262 Difficulty in walking, not elsewhere classified: Secondary | ICD-10-CM | POA: Diagnosis not present

## 2016-05-15 DIAGNOSIS — R296 Repeated falls: Secondary | ICD-10-CM | POA: Diagnosis not present

## 2016-05-15 DIAGNOSIS — R278 Other lack of coordination: Secondary | ICD-10-CM | POA: Diagnosis not present

## 2016-05-18 DIAGNOSIS — R531 Weakness: Secondary | ICD-10-CM | POA: Diagnosis not present

## 2016-05-20 DIAGNOSIS — R278 Other lack of coordination: Secondary | ICD-10-CM | POA: Diagnosis not present

## 2016-05-20 DIAGNOSIS — R296 Repeated falls: Secondary | ICD-10-CM | POA: Diagnosis not present

## 2016-05-20 DIAGNOSIS — M6281 Muscle weakness (generalized): Secondary | ICD-10-CM | POA: Diagnosis not present

## 2016-05-20 DIAGNOSIS — R2681 Unsteadiness on feet: Secondary | ICD-10-CM | POA: Diagnosis not present

## 2016-05-20 DIAGNOSIS — R262 Difficulty in walking, not elsewhere classified: Secondary | ICD-10-CM | POA: Diagnosis not present

## 2016-05-22 DIAGNOSIS — M6281 Muscle weakness (generalized): Secondary | ICD-10-CM | POA: Diagnosis not present

## 2016-05-22 DIAGNOSIS — R262 Difficulty in walking, not elsewhere classified: Secondary | ICD-10-CM | POA: Diagnosis not present

## 2016-05-22 DIAGNOSIS — R2681 Unsteadiness on feet: Secondary | ICD-10-CM | POA: Diagnosis not present

## 2016-05-22 DIAGNOSIS — R278 Other lack of coordination: Secondary | ICD-10-CM | POA: Diagnosis not present

## 2016-05-22 DIAGNOSIS — R296 Repeated falls: Secondary | ICD-10-CM | POA: Diagnosis not present

## 2016-05-25 DIAGNOSIS — R262 Difficulty in walking, not elsewhere classified: Secondary | ICD-10-CM | POA: Diagnosis not present

## 2016-05-25 DIAGNOSIS — R278 Other lack of coordination: Secondary | ICD-10-CM | POA: Diagnosis not present

## 2016-05-25 DIAGNOSIS — M6281 Muscle weakness (generalized): Secondary | ICD-10-CM | POA: Diagnosis not present

## 2016-05-25 DIAGNOSIS — R296 Repeated falls: Secondary | ICD-10-CM | POA: Diagnosis not present

## 2016-05-25 DIAGNOSIS — R2681 Unsteadiness on feet: Secondary | ICD-10-CM | POA: Diagnosis not present

## 2016-05-26 DIAGNOSIS — R2681 Unsteadiness on feet: Secondary | ICD-10-CM | POA: Diagnosis not present

## 2016-05-26 DIAGNOSIS — R278 Other lack of coordination: Secondary | ICD-10-CM | POA: Diagnosis not present

## 2016-05-26 DIAGNOSIS — R262 Difficulty in walking, not elsewhere classified: Secondary | ICD-10-CM | POA: Diagnosis not present

## 2016-05-26 DIAGNOSIS — M6281 Muscle weakness (generalized): Secondary | ICD-10-CM | POA: Diagnosis not present

## 2016-05-26 DIAGNOSIS — R296 Repeated falls: Secondary | ICD-10-CM | POA: Diagnosis not present

## 2016-05-28 DIAGNOSIS — M6281 Muscle weakness (generalized): Secondary | ICD-10-CM | POA: Diagnosis not present

## 2016-05-28 DIAGNOSIS — R296 Repeated falls: Secondary | ICD-10-CM | POA: Diagnosis not present

## 2016-05-28 DIAGNOSIS — R278 Other lack of coordination: Secondary | ICD-10-CM | POA: Diagnosis not present

## 2016-05-28 DIAGNOSIS — R2681 Unsteadiness on feet: Secondary | ICD-10-CM | POA: Diagnosis not present

## 2016-05-28 DIAGNOSIS — R262 Difficulty in walking, not elsewhere classified: Secondary | ICD-10-CM | POA: Diagnosis not present

## 2016-06-11 DIAGNOSIS — R296 Repeated falls: Secondary | ICD-10-CM | POA: Diagnosis not present

## 2016-06-11 DIAGNOSIS — M6281 Muscle weakness (generalized): Secondary | ICD-10-CM | POA: Diagnosis not present

## 2016-06-11 DIAGNOSIS — R2681 Unsteadiness on feet: Secondary | ICD-10-CM | POA: Diagnosis not present

## 2016-06-11 DIAGNOSIS — R262 Difficulty in walking, not elsewhere classified: Secondary | ICD-10-CM | POA: Diagnosis not present

## 2016-06-11 DIAGNOSIS — R278 Other lack of coordination: Secondary | ICD-10-CM | POA: Diagnosis not present

## 2016-06-17 DIAGNOSIS — M25511 Pain in right shoulder: Secondary | ICD-10-CM | POA: Diagnosis not present

## 2016-06-17 DIAGNOSIS — F0280 Dementia in other diseases classified elsewhere without behavioral disturbance: Secondary | ICD-10-CM | POA: Diagnosis not present

## 2016-06-17 DIAGNOSIS — G301 Alzheimer's disease with late onset: Secondary | ICD-10-CM | POA: Diagnosis not present

## 2016-06-17 DIAGNOSIS — Z79899 Other long term (current) drug therapy: Secondary | ICD-10-CM | POA: Diagnosis not present

## 2016-06-17 DIAGNOSIS — I340 Nonrheumatic mitral (valve) insufficiency: Secondary | ICD-10-CM | POA: Diagnosis not present

## 2016-06-17 DIAGNOSIS — N183 Chronic kidney disease, stage 3 (moderate): Secondary | ICD-10-CM | POA: Diagnosis not present

## 2016-06-17 DIAGNOSIS — I129 Hypertensive chronic kidney disease with stage 1 through stage 4 chronic kidney disease, or unspecified chronic kidney disease: Secondary | ICD-10-CM | POA: Diagnosis not present

## 2016-08-21 DIAGNOSIS — G301 Alzheimer's disease with late onset: Secondary | ICD-10-CM | POA: Diagnosis not present

## 2016-08-21 DIAGNOSIS — I129 Hypertensive chronic kidney disease with stage 1 through stage 4 chronic kidney disease, or unspecified chronic kidney disease: Secondary | ICD-10-CM | POA: Diagnosis not present

## 2016-08-21 DIAGNOSIS — R269 Unspecified abnormalities of gait and mobility: Secondary | ICD-10-CM | POA: Diagnosis not present

## 2016-08-21 DIAGNOSIS — N183 Chronic kidney disease, stage 3 (moderate): Secondary | ICD-10-CM | POA: Diagnosis not present

## 2016-08-21 DIAGNOSIS — M25511 Pain in right shoulder: Secondary | ICD-10-CM | POA: Diagnosis not present

## 2016-08-27 DIAGNOSIS — R531 Weakness: Secondary | ICD-10-CM | POA: Diagnosis not present

## 2016-10-16 DIAGNOSIS — R531 Weakness: Secondary | ICD-10-CM | POA: Diagnosis not present

## 2016-11-20 DIAGNOSIS — R531 Weakness: Secondary | ICD-10-CM | POA: Diagnosis not present

## 2016-12-08 IMAGING — DX DG LUMBAR SPINE COMPLETE 4+V
5 series · 5 of 5 positions shown · non-contrast
Comparison: None.

CLINICAL DATA: Status post fall, with generalized back pain.
Initial encounter.

EXAM:
LUMBAR SPINE - COMPLETE 4+ VIEW

[l-spine ap]
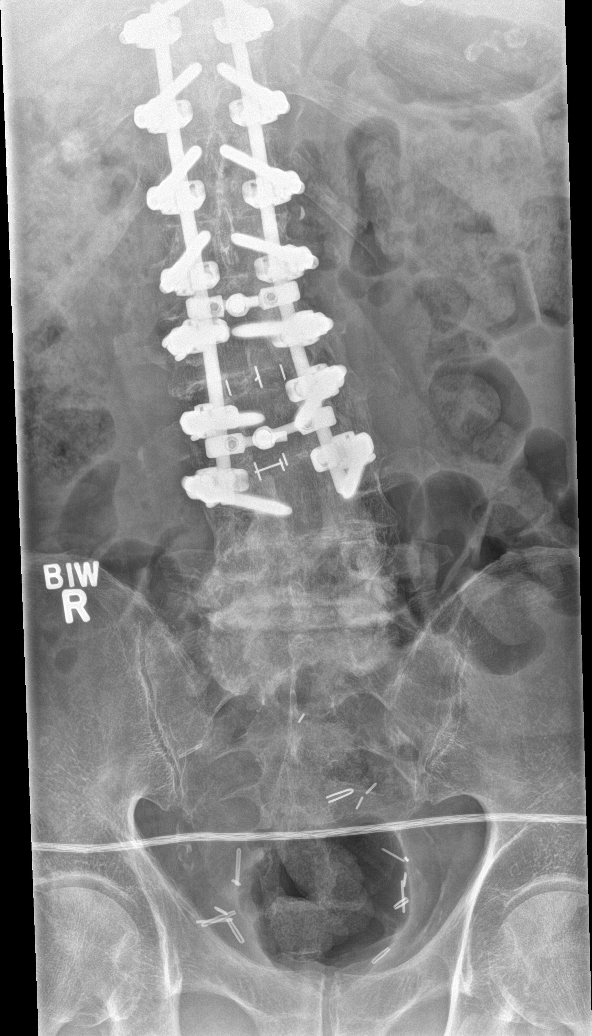

[l-spine obl (1 of 2)]
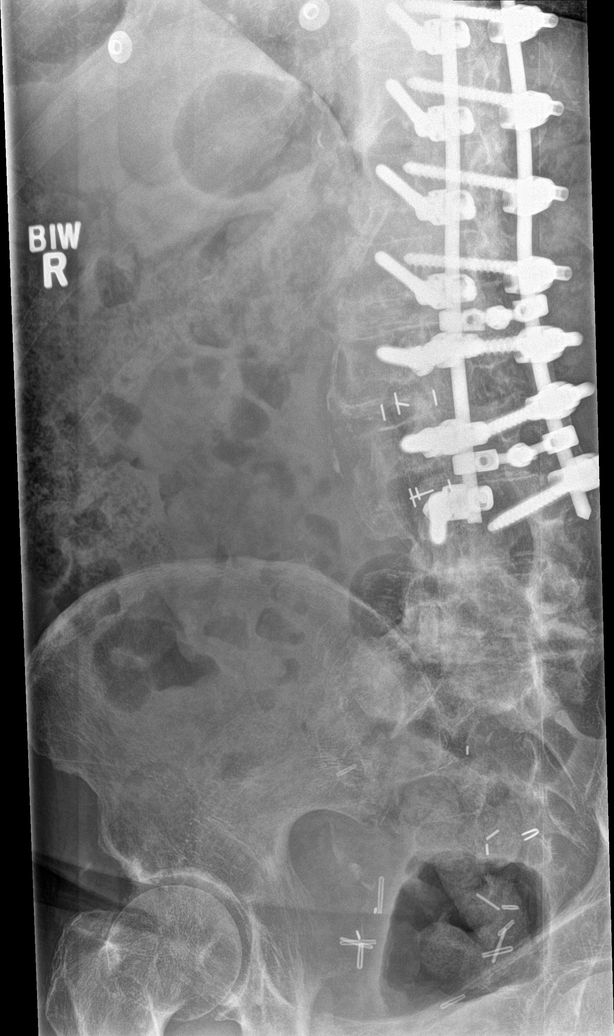

[l-spine obl (2 of 2)]
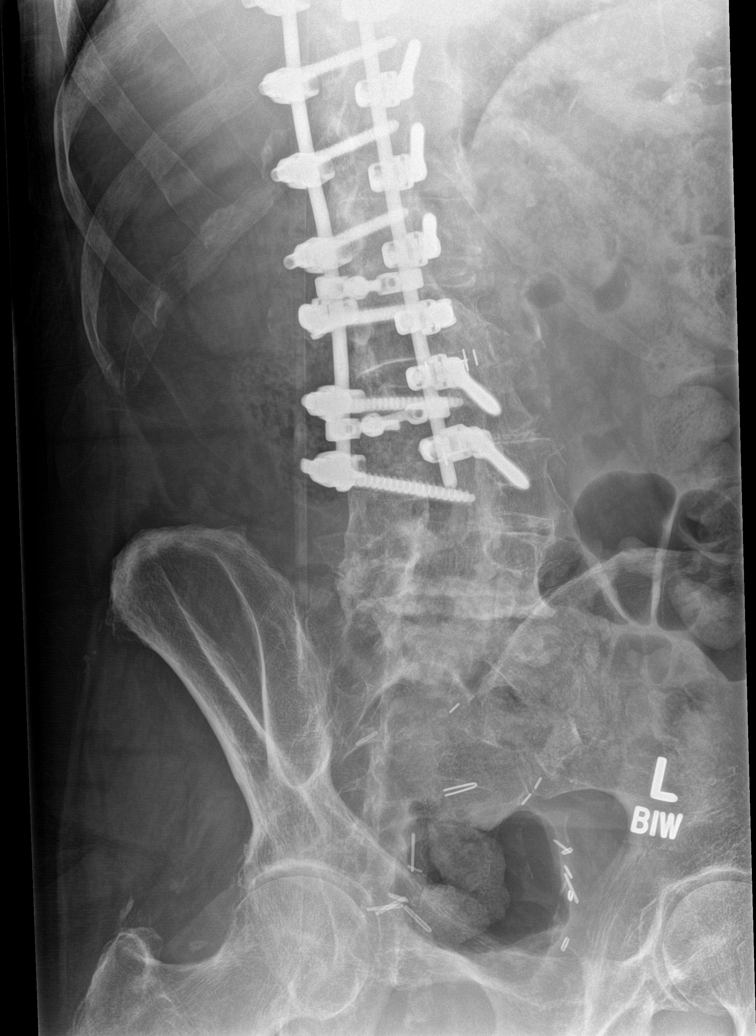

[l-spine lat]
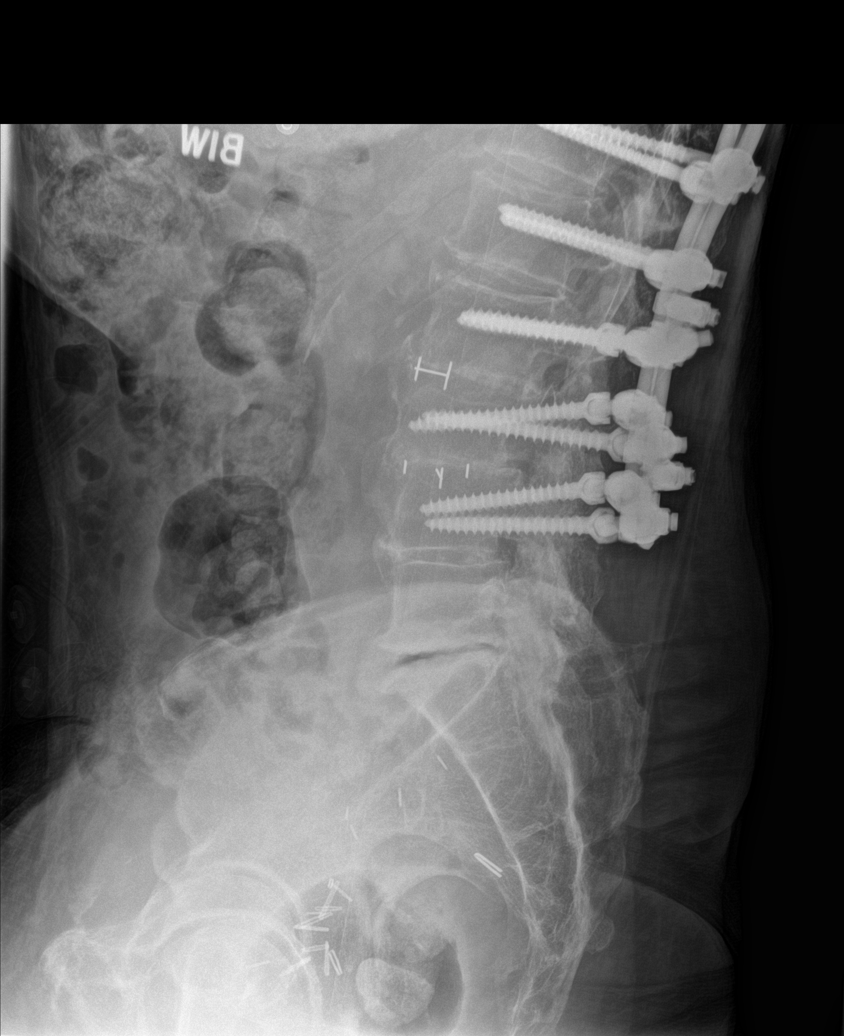

[l-spine spot]
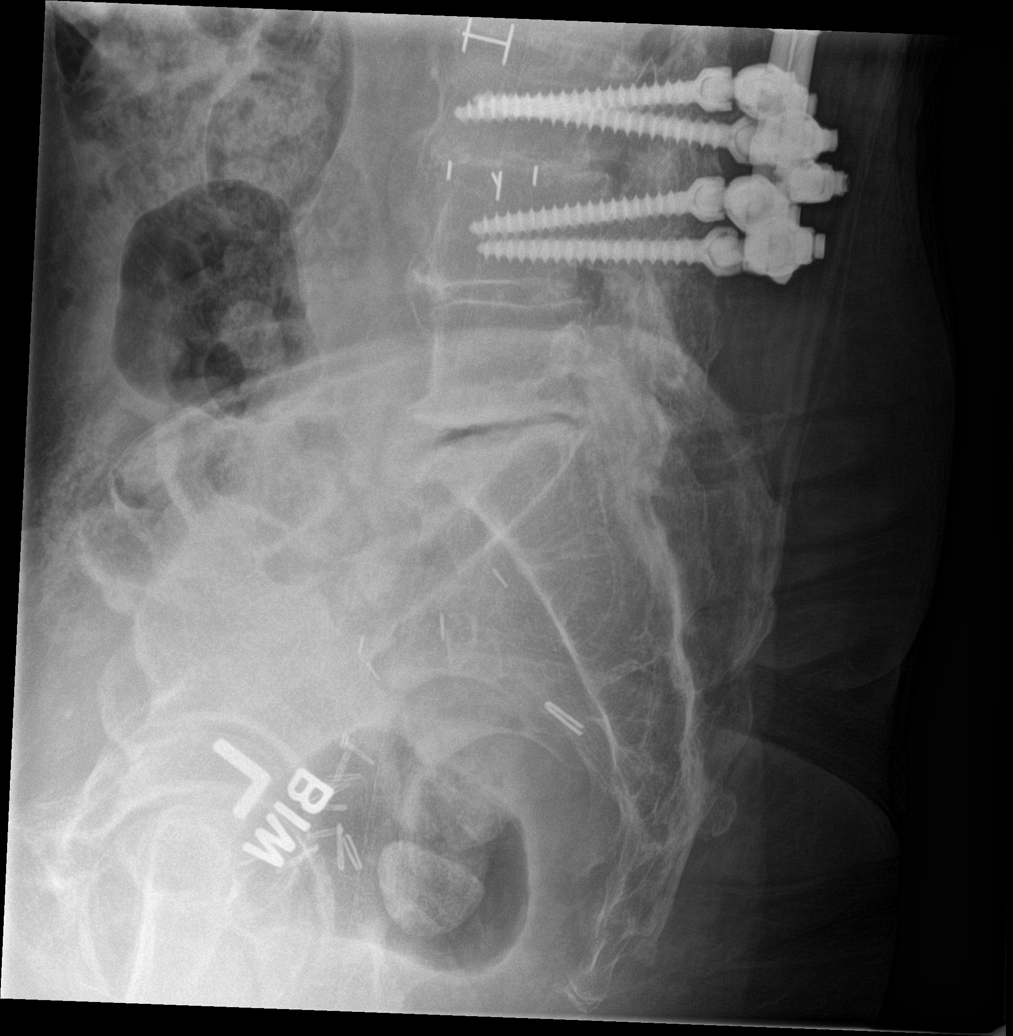

[5 of 5 positions shown; findings below may reference images not displayed]

FINDINGS: There is no evidence of fracture or subluxation. Vertebral bodies
demonstrate normal height and alignment. Vacuum phenomenon and
endplate sclerotic change are seen at L5-S1. Lumbar spinal fusion
extends from T10-L4.

The visualized bowel gas pattern is unremarkable in appearance; air
and stool are noted within the colon. The sacroiliac joints are
within normal limits. Scattered clips are seen within the pelvis.
IMPRESSION: 1. No evidence of fracture or subluxation along the lumbar spine.
2. Postoperative change along the lower thoracic and lumbar spine,
and degenerative change at L5-S1.

## 2016-12-22 DIAGNOSIS — G8929 Other chronic pain: Secondary | ICD-10-CM | POA: Diagnosis not present

## 2016-12-22 DIAGNOSIS — I129 Hypertensive chronic kidney disease with stage 1 through stage 4 chronic kidney disease, or unspecified chronic kidney disease: Secondary | ICD-10-CM | POA: Diagnosis not present

## 2016-12-22 DIAGNOSIS — N183 Chronic kidney disease, stage 3 (moderate): Secondary | ICD-10-CM | POA: Diagnosis not present

## 2016-12-22 DIAGNOSIS — H6121 Impacted cerumen, right ear: Secondary | ICD-10-CM | POA: Diagnosis not present

## 2016-12-22 DIAGNOSIS — M25511 Pain in right shoulder: Secondary | ICD-10-CM | POA: Diagnosis not present

## 2016-12-29 DIAGNOSIS — H6123 Impacted cerumen, bilateral: Secondary | ICD-10-CM | POA: Diagnosis not present

## 2016-12-29 DIAGNOSIS — H60333 Swimmer's ear, bilateral: Secondary | ICD-10-CM | POA: Diagnosis not present

## 2017-01-06 DIAGNOSIS — R531 Weakness: Secondary | ICD-10-CM | POA: Diagnosis not present

## 2017-04-08 DIAGNOSIS — R531 Weakness: Secondary | ICD-10-CM | POA: Diagnosis not present

## 2017-05-21 DIAGNOSIS — R531 Weakness: Secondary | ICD-10-CM | POA: Diagnosis not present

## 2017-05-24 DIAGNOSIS — Z8546 Personal history of malignant neoplasm of prostate: Secondary | ICD-10-CM | POA: Diagnosis not present

## 2017-05-24 DIAGNOSIS — I679 Cerebrovascular disease, unspecified: Secondary | ICD-10-CM | POA: Diagnosis not present

## 2017-05-24 DIAGNOSIS — G20 Parkinson's disease: Secondary | ICD-10-CM | POA: Diagnosis not present

## 2017-05-24 DIAGNOSIS — R54 Age-related physical debility: Secondary | ICD-10-CM | POA: Diagnosis not present

## 2017-05-24 DIAGNOSIS — G3183 Dementia with Lewy bodies: Secondary | ICD-10-CM | POA: Diagnosis not present

## 2017-05-24 DIAGNOSIS — I10 Essential (primary) hypertension: Secondary | ICD-10-CM | POA: Diagnosis not present

## 2017-05-24 DIAGNOSIS — F339 Major depressive disorder, recurrent, unspecified: Secondary | ICD-10-CM | POA: Diagnosis not present

## 2017-05-25 DIAGNOSIS — F339 Major depressive disorder, recurrent, unspecified: Secondary | ICD-10-CM | POA: Diagnosis not present

## 2017-05-25 DIAGNOSIS — I10 Essential (primary) hypertension: Secondary | ICD-10-CM | POA: Diagnosis not present

## 2017-05-25 DIAGNOSIS — R54 Age-related physical debility: Secondary | ICD-10-CM | POA: Diagnosis not present

## 2017-05-25 DIAGNOSIS — G3183 Dementia with Lewy bodies: Secondary | ICD-10-CM | POA: Diagnosis not present

## 2017-05-25 DIAGNOSIS — G20 Parkinson's disease: Secondary | ICD-10-CM | POA: Diagnosis not present

## 2017-05-25 DIAGNOSIS — I679 Cerebrovascular disease, unspecified: Secondary | ICD-10-CM | POA: Diagnosis not present

## 2017-05-26 DIAGNOSIS — I679 Cerebrovascular disease, unspecified: Secondary | ICD-10-CM | POA: Diagnosis not present

## 2017-05-26 DIAGNOSIS — G20 Parkinson's disease: Secondary | ICD-10-CM | POA: Diagnosis not present

## 2017-05-26 DIAGNOSIS — I10 Essential (primary) hypertension: Secondary | ICD-10-CM | POA: Diagnosis not present

## 2017-05-26 DIAGNOSIS — G3183 Dementia with Lewy bodies: Secondary | ICD-10-CM | POA: Diagnosis not present

## 2017-05-26 DIAGNOSIS — R54 Age-related physical debility: Secondary | ICD-10-CM | POA: Diagnosis not present

## 2017-05-26 DIAGNOSIS — F339 Major depressive disorder, recurrent, unspecified: Secondary | ICD-10-CM | POA: Diagnosis not present

## 2017-05-28 DIAGNOSIS — G3183 Dementia with Lewy bodies: Secondary | ICD-10-CM | POA: Diagnosis not present

## 2017-05-28 DIAGNOSIS — I679 Cerebrovascular disease, unspecified: Secondary | ICD-10-CM | POA: Diagnosis not present

## 2017-05-28 DIAGNOSIS — R54 Age-related physical debility: Secondary | ICD-10-CM | POA: Diagnosis not present

## 2017-05-28 DIAGNOSIS — I10 Essential (primary) hypertension: Secondary | ICD-10-CM | POA: Diagnosis not present

## 2017-05-28 DIAGNOSIS — F339 Major depressive disorder, recurrent, unspecified: Secondary | ICD-10-CM | POA: Diagnosis not present

## 2017-05-28 DIAGNOSIS — G20 Parkinson's disease: Secondary | ICD-10-CM | POA: Diagnosis not present

## 2017-06-01 DIAGNOSIS — I10 Essential (primary) hypertension: Secondary | ICD-10-CM | POA: Diagnosis not present

## 2017-06-01 DIAGNOSIS — G20 Parkinson's disease: Secondary | ICD-10-CM | POA: Diagnosis not present

## 2017-06-01 DIAGNOSIS — G3183 Dementia with Lewy bodies: Secondary | ICD-10-CM | POA: Diagnosis not present

## 2017-06-01 DIAGNOSIS — F339 Major depressive disorder, recurrent, unspecified: Secondary | ICD-10-CM | POA: Diagnosis not present

## 2017-06-01 DIAGNOSIS — I679 Cerebrovascular disease, unspecified: Secondary | ICD-10-CM | POA: Diagnosis not present

## 2017-06-01 DIAGNOSIS — R54 Age-related physical debility: Secondary | ICD-10-CM | POA: Diagnosis not present

## 2017-06-02 DIAGNOSIS — R54 Age-related physical debility: Secondary | ICD-10-CM | POA: Diagnosis not present

## 2017-06-02 DIAGNOSIS — F339 Major depressive disorder, recurrent, unspecified: Secondary | ICD-10-CM | POA: Diagnosis not present

## 2017-06-02 DIAGNOSIS — G3183 Dementia with Lewy bodies: Secondary | ICD-10-CM | POA: Diagnosis not present

## 2017-06-02 DIAGNOSIS — I679 Cerebrovascular disease, unspecified: Secondary | ICD-10-CM | POA: Diagnosis not present

## 2017-06-02 DIAGNOSIS — I10 Essential (primary) hypertension: Secondary | ICD-10-CM | POA: Diagnosis not present

## 2017-06-02 DIAGNOSIS — G20 Parkinson's disease: Secondary | ICD-10-CM | POA: Diagnosis not present

## 2017-06-08 DIAGNOSIS — R54 Age-related physical debility: Secondary | ICD-10-CM | POA: Diagnosis not present

## 2017-06-08 DIAGNOSIS — F339 Major depressive disorder, recurrent, unspecified: Secondary | ICD-10-CM | POA: Diagnosis not present

## 2017-06-08 DIAGNOSIS — I10 Essential (primary) hypertension: Secondary | ICD-10-CM | POA: Diagnosis not present

## 2017-06-08 DIAGNOSIS — G20 Parkinson's disease: Secondary | ICD-10-CM | POA: Diagnosis not present

## 2017-06-08 DIAGNOSIS — I679 Cerebrovascular disease, unspecified: Secondary | ICD-10-CM | POA: Diagnosis not present

## 2017-06-08 DIAGNOSIS — G3183 Dementia with Lewy bodies: Secondary | ICD-10-CM | POA: Diagnosis not present

## 2017-06-10 DIAGNOSIS — G3183 Dementia with Lewy bodies: Secondary | ICD-10-CM | POA: Diagnosis not present

## 2017-06-10 DIAGNOSIS — I10 Essential (primary) hypertension: Secondary | ICD-10-CM | POA: Diagnosis not present

## 2017-06-10 DIAGNOSIS — F339 Major depressive disorder, recurrent, unspecified: Secondary | ICD-10-CM | POA: Diagnosis not present

## 2017-06-10 DIAGNOSIS — R54 Age-related physical debility: Secondary | ICD-10-CM | POA: Diagnosis not present

## 2017-06-10 DIAGNOSIS — I679 Cerebrovascular disease, unspecified: Secondary | ICD-10-CM | POA: Diagnosis not present

## 2017-06-10 DIAGNOSIS — G20 Parkinson's disease: Secondary | ICD-10-CM | POA: Diagnosis not present

## 2017-06-11 DIAGNOSIS — G20 Parkinson's disease: Secondary | ICD-10-CM | POA: Diagnosis not present

## 2017-06-11 DIAGNOSIS — F339 Major depressive disorder, recurrent, unspecified: Secondary | ICD-10-CM | POA: Diagnosis not present

## 2017-06-11 DIAGNOSIS — I10 Essential (primary) hypertension: Secondary | ICD-10-CM | POA: Diagnosis not present

## 2017-06-11 DIAGNOSIS — R54 Age-related physical debility: Secondary | ICD-10-CM | POA: Diagnosis not present

## 2017-06-11 DIAGNOSIS — G3183 Dementia with Lewy bodies: Secondary | ICD-10-CM | POA: Diagnosis not present

## 2017-06-11 DIAGNOSIS — I679 Cerebrovascular disease, unspecified: Secondary | ICD-10-CM | POA: Diagnosis not present

## 2017-06-15 DIAGNOSIS — I679 Cerebrovascular disease, unspecified: Secondary | ICD-10-CM | POA: Diagnosis not present

## 2017-06-15 DIAGNOSIS — G20 Parkinson's disease: Secondary | ICD-10-CM | POA: Diagnosis not present

## 2017-06-15 DIAGNOSIS — G3183 Dementia with Lewy bodies: Secondary | ICD-10-CM | POA: Diagnosis not present

## 2017-06-15 DIAGNOSIS — I10 Essential (primary) hypertension: Secondary | ICD-10-CM | POA: Diagnosis not present

## 2017-06-15 DIAGNOSIS — F339 Major depressive disorder, recurrent, unspecified: Secondary | ICD-10-CM | POA: Diagnosis not present

## 2017-06-15 DIAGNOSIS — R54 Age-related physical debility: Secondary | ICD-10-CM | POA: Diagnosis not present

## 2017-06-16 DIAGNOSIS — Z8546 Personal history of malignant neoplasm of prostate: Secondary | ICD-10-CM | POA: Diagnosis not present

## 2017-06-16 DIAGNOSIS — I10 Essential (primary) hypertension: Secondary | ICD-10-CM | POA: Diagnosis not present

## 2017-06-16 DIAGNOSIS — G20 Parkinson's disease: Secondary | ICD-10-CM | POA: Diagnosis not present

## 2017-06-16 DIAGNOSIS — I679 Cerebrovascular disease, unspecified: Secondary | ICD-10-CM | POA: Diagnosis not present

## 2017-06-16 DIAGNOSIS — G3183 Dementia with Lewy bodies: Secondary | ICD-10-CM | POA: Diagnosis not present

## 2017-06-16 DIAGNOSIS — F339 Major depressive disorder, recurrent, unspecified: Secondary | ICD-10-CM | POA: Diagnosis not present

## 2017-06-16 DIAGNOSIS — R54 Age-related physical debility: Secondary | ICD-10-CM | POA: Diagnosis not present

## 2017-06-18 DIAGNOSIS — I10 Essential (primary) hypertension: Secondary | ICD-10-CM | POA: Diagnosis not present

## 2017-06-18 DIAGNOSIS — R54 Age-related physical debility: Secondary | ICD-10-CM | POA: Diagnosis not present

## 2017-06-18 DIAGNOSIS — G20 Parkinson's disease: Secondary | ICD-10-CM | POA: Diagnosis not present

## 2017-06-18 DIAGNOSIS — F339 Major depressive disorder, recurrent, unspecified: Secondary | ICD-10-CM | POA: Diagnosis not present

## 2017-06-18 DIAGNOSIS — I679 Cerebrovascular disease, unspecified: Secondary | ICD-10-CM | POA: Diagnosis not present

## 2017-06-18 DIAGNOSIS — G3183 Dementia with Lewy bodies: Secondary | ICD-10-CM | POA: Diagnosis not present

## 2017-06-22 DIAGNOSIS — R54 Age-related physical debility: Secondary | ICD-10-CM | POA: Diagnosis not present

## 2017-06-22 DIAGNOSIS — G3183 Dementia with Lewy bodies: Secondary | ICD-10-CM | POA: Diagnosis not present

## 2017-06-22 DIAGNOSIS — I679 Cerebrovascular disease, unspecified: Secondary | ICD-10-CM | POA: Diagnosis not present

## 2017-06-22 DIAGNOSIS — F339 Major depressive disorder, recurrent, unspecified: Secondary | ICD-10-CM | POA: Diagnosis not present

## 2017-06-22 DIAGNOSIS — G20 Parkinson's disease: Secondary | ICD-10-CM | POA: Diagnosis not present

## 2017-06-22 DIAGNOSIS — I10 Essential (primary) hypertension: Secondary | ICD-10-CM | POA: Diagnosis not present

## 2017-06-25 DIAGNOSIS — G3183 Dementia with Lewy bodies: Secondary | ICD-10-CM | POA: Diagnosis not present

## 2017-06-25 DIAGNOSIS — R54 Age-related physical debility: Secondary | ICD-10-CM | POA: Diagnosis not present

## 2017-06-25 DIAGNOSIS — F339 Major depressive disorder, recurrent, unspecified: Secondary | ICD-10-CM | POA: Diagnosis not present

## 2017-06-25 DIAGNOSIS — I679 Cerebrovascular disease, unspecified: Secondary | ICD-10-CM | POA: Diagnosis not present

## 2017-06-25 DIAGNOSIS — G20 Parkinson's disease: Secondary | ICD-10-CM | POA: Diagnosis not present

## 2017-06-25 DIAGNOSIS — I10 Essential (primary) hypertension: Secondary | ICD-10-CM | POA: Diagnosis not present

## 2017-06-28 DIAGNOSIS — R54 Age-related physical debility: Secondary | ICD-10-CM | POA: Diagnosis not present

## 2017-06-28 DIAGNOSIS — G3183 Dementia with Lewy bodies: Secondary | ICD-10-CM | POA: Diagnosis not present

## 2017-06-28 DIAGNOSIS — G20 Parkinson's disease: Secondary | ICD-10-CM | POA: Diagnosis not present

## 2017-06-28 DIAGNOSIS — I679 Cerebrovascular disease, unspecified: Secondary | ICD-10-CM | POA: Diagnosis not present

## 2017-06-28 DIAGNOSIS — F339 Major depressive disorder, recurrent, unspecified: Secondary | ICD-10-CM | POA: Diagnosis not present

## 2017-06-28 DIAGNOSIS — I10 Essential (primary) hypertension: Secondary | ICD-10-CM | POA: Diagnosis not present

## 2017-06-29 DIAGNOSIS — G20 Parkinson's disease: Secondary | ICD-10-CM | POA: Diagnosis not present

## 2017-06-29 DIAGNOSIS — F339 Major depressive disorder, recurrent, unspecified: Secondary | ICD-10-CM | POA: Diagnosis not present

## 2017-06-29 DIAGNOSIS — G3183 Dementia with Lewy bodies: Secondary | ICD-10-CM | POA: Diagnosis not present

## 2017-06-29 DIAGNOSIS — I679 Cerebrovascular disease, unspecified: Secondary | ICD-10-CM | POA: Diagnosis not present

## 2017-06-29 DIAGNOSIS — R54 Age-related physical debility: Secondary | ICD-10-CM | POA: Diagnosis not present

## 2017-06-29 DIAGNOSIS — I10 Essential (primary) hypertension: Secondary | ICD-10-CM | POA: Diagnosis not present

## 2017-06-30 DIAGNOSIS — G3183 Dementia with Lewy bodies: Secondary | ICD-10-CM | POA: Diagnosis not present

## 2017-06-30 DIAGNOSIS — G20 Parkinson's disease: Secondary | ICD-10-CM | POA: Diagnosis not present

## 2017-06-30 DIAGNOSIS — I10 Essential (primary) hypertension: Secondary | ICD-10-CM | POA: Diagnosis not present

## 2017-06-30 DIAGNOSIS — F339 Major depressive disorder, recurrent, unspecified: Secondary | ICD-10-CM | POA: Diagnosis not present

## 2017-06-30 DIAGNOSIS — I679 Cerebrovascular disease, unspecified: Secondary | ICD-10-CM | POA: Diagnosis not present

## 2017-06-30 DIAGNOSIS — R54 Age-related physical debility: Secondary | ICD-10-CM | POA: Diagnosis not present

## 2017-07-02 DIAGNOSIS — I10 Essential (primary) hypertension: Secondary | ICD-10-CM | POA: Diagnosis not present

## 2017-07-02 DIAGNOSIS — I679 Cerebrovascular disease, unspecified: Secondary | ICD-10-CM | POA: Diagnosis not present

## 2017-07-02 DIAGNOSIS — G3183 Dementia with Lewy bodies: Secondary | ICD-10-CM | POA: Diagnosis not present

## 2017-07-02 DIAGNOSIS — F339 Major depressive disorder, recurrent, unspecified: Secondary | ICD-10-CM | POA: Diagnosis not present

## 2017-07-02 DIAGNOSIS — R54 Age-related physical debility: Secondary | ICD-10-CM | POA: Diagnosis not present

## 2017-07-02 DIAGNOSIS — G20 Parkinson's disease: Secondary | ICD-10-CM | POA: Diagnosis not present

## 2017-07-06 DIAGNOSIS — G20 Parkinson's disease: Secondary | ICD-10-CM | POA: Diagnosis not present

## 2017-07-06 DIAGNOSIS — I679 Cerebrovascular disease, unspecified: Secondary | ICD-10-CM | POA: Diagnosis not present

## 2017-07-06 DIAGNOSIS — G3183 Dementia with Lewy bodies: Secondary | ICD-10-CM | POA: Diagnosis not present

## 2017-07-06 DIAGNOSIS — R54 Age-related physical debility: Secondary | ICD-10-CM | POA: Diagnosis not present

## 2017-07-06 DIAGNOSIS — F339 Major depressive disorder, recurrent, unspecified: Secondary | ICD-10-CM | POA: Diagnosis not present

## 2017-07-06 DIAGNOSIS — I10 Essential (primary) hypertension: Secondary | ICD-10-CM | POA: Diagnosis not present

## 2017-07-09 DIAGNOSIS — G3183 Dementia with Lewy bodies: Secondary | ICD-10-CM | POA: Diagnosis not present

## 2017-07-09 DIAGNOSIS — G20 Parkinson's disease: Secondary | ICD-10-CM | POA: Diagnosis not present

## 2017-07-09 DIAGNOSIS — I679 Cerebrovascular disease, unspecified: Secondary | ICD-10-CM | POA: Diagnosis not present

## 2017-07-09 DIAGNOSIS — I10 Essential (primary) hypertension: Secondary | ICD-10-CM | POA: Diagnosis not present

## 2017-07-09 DIAGNOSIS — R54 Age-related physical debility: Secondary | ICD-10-CM | POA: Diagnosis not present

## 2017-07-09 DIAGNOSIS — F339 Major depressive disorder, recurrent, unspecified: Secondary | ICD-10-CM | POA: Diagnosis not present

## 2017-07-13 DIAGNOSIS — I679 Cerebrovascular disease, unspecified: Secondary | ICD-10-CM | POA: Diagnosis not present

## 2017-07-13 DIAGNOSIS — R54 Age-related physical debility: Secondary | ICD-10-CM | POA: Diagnosis not present

## 2017-07-13 DIAGNOSIS — I10 Essential (primary) hypertension: Secondary | ICD-10-CM | POA: Diagnosis not present

## 2017-07-13 DIAGNOSIS — F339 Major depressive disorder, recurrent, unspecified: Secondary | ICD-10-CM | POA: Diagnosis not present

## 2017-07-13 DIAGNOSIS — G20 Parkinson's disease: Secondary | ICD-10-CM | POA: Diagnosis not present

## 2017-07-13 DIAGNOSIS — G3183 Dementia with Lewy bodies: Secondary | ICD-10-CM | POA: Diagnosis not present

## 2017-07-15 DIAGNOSIS — R54 Age-related physical debility: Secondary | ICD-10-CM | POA: Diagnosis not present

## 2017-07-15 DIAGNOSIS — I679 Cerebrovascular disease, unspecified: Secondary | ICD-10-CM | POA: Diagnosis not present

## 2017-07-15 DIAGNOSIS — I10 Essential (primary) hypertension: Secondary | ICD-10-CM | POA: Diagnosis not present

## 2017-07-15 DIAGNOSIS — G3183 Dementia with Lewy bodies: Secondary | ICD-10-CM | POA: Diagnosis not present

## 2017-07-15 DIAGNOSIS — F339 Major depressive disorder, recurrent, unspecified: Secondary | ICD-10-CM | POA: Diagnosis not present

## 2017-07-15 DIAGNOSIS — G20 Parkinson's disease: Secondary | ICD-10-CM | POA: Diagnosis not present

## 2017-07-16 DIAGNOSIS — G3183 Dementia with Lewy bodies: Secondary | ICD-10-CM | POA: Diagnosis not present

## 2017-07-16 DIAGNOSIS — G20 Parkinson's disease: Secondary | ICD-10-CM | POA: Diagnosis not present

## 2017-07-16 DIAGNOSIS — F339 Major depressive disorder, recurrent, unspecified: Secondary | ICD-10-CM | POA: Diagnosis not present

## 2017-07-16 DIAGNOSIS — R54 Age-related physical debility: Secondary | ICD-10-CM | POA: Diagnosis not present

## 2017-07-16 DIAGNOSIS — I679 Cerebrovascular disease, unspecified: Secondary | ICD-10-CM | POA: Diagnosis not present

## 2017-07-16 DIAGNOSIS — I10 Essential (primary) hypertension: Secondary | ICD-10-CM | POA: Diagnosis not present

## 2017-07-17 DIAGNOSIS — Z8546 Personal history of malignant neoplasm of prostate: Secondary | ICD-10-CM | POA: Diagnosis not present

## 2017-07-17 DIAGNOSIS — R54 Age-related physical debility: Secondary | ICD-10-CM | POA: Diagnosis not present

## 2017-07-17 DIAGNOSIS — G3183 Dementia with Lewy bodies: Secondary | ICD-10-CM | POA: Diagnosis not present

## 2017-07-17 DIAGNOSIS — I10 Essential (primary) hypertension: Secondary | ICD-10-CM | POA: Diagnosis not present

## 2017-07-17 DIAGNOSIS — G20 Parkinson's disease: Secondary | ICD-10-CM | POA: Diagnosis not present

## 2017-07-17 DIAGNOSIS — F339 Major depressive disorder, recurrent, unspecified: Secondary | ICD-10-CM | POA: Diagnosis not present

## 2017-07-17 DIAGNOSIS — I679 Cerebrovascular disease, unspecified: Secondary | ICD-10-CM | POA: Diagnosis not present

## 2017-07-20 DIAGNOSIS — I10 Essential (primary) hypertension: Secondary | ICD-10-CM | POA: Diagnosis not present

## 2017-07-20 DIAGNOSIS — R54 Age-related physical debility: Secondary | ICD-10-CM | POA: Diagnosis not present

## 2017-07-20 DIAGNOSIS — I679 Cerebrovascular disease, unspecified: Secondary | ICD-10-CM | POA: Diagnosis not present

## 2017-07-20 DIAGNOSIS — G20 Parkinson's disease: Secondary | ICD-10-CM | POA: Diagnosis not present

## 2017-07-20 DIAGNOSIS — F339 Major depressive disorder, recurrent, unspecified: Secondary | ICD-10-CM | POA: Diagnosis not present

## 2017-07-20 DIAGNOSIS — G3183 Dementia with Lewy bodies: Secondary | ICD-10-CM | POA: Diagnosis not present

## 2017-07-23 DIAGNOSIS — I10 Essential (primary) hypertension: Secondary | ICD-10-CM | POA: Diagnosis not present

## 2017-07-23 DIAGNOSIS — I679 Cerebrovascular disease, unspecified: Secondary | ICD-10-CM | POA: Diagnosis not present

## 2017-07-23 DIAGNOSIS — G3183 Dementia with Lewy bodies: Secondary | ICD-10-CM | POA: Diagnosis not present

## 2017-07-23 DIAGNOSIS — F339 Major depressive disorder, recurrent, unspecified: Secondary | ICD-10-CM | POA: Diagnosis not present

## 2017-07-23 DIAGNOSIS — G20 Parkinson's disease: Secondary | ICD-10-CM | POA: Diagnosis not present

## 2017-07-23 DIAGNOSIS — R54 Age-related physical debility: Secondary | ICD-10-CM | POA: Diagnosis not present

## 2017-07-27 DIAGNOSIS — F339 Major depressive disorder, recurrent, unspecified: Secondary | ICD-10-CM | POA: Diagnosis not present

## 2017-07-27 DIAGNOSIS — I679 Cerebrovascular disease, unspecified: Secondary | ICD-10-CM | POA: Diagnosis not present

## 2017-07-27 DIAGNOSIS — G3183 Dementia with Lewy bodies: Secondary | ICD-10-CM | POA: Diagnosis not present

## 2017-07-27 DIAGNOSIS — R54 Age-related physical debility: Secondary | ICD-10-CM | POA: Diagnosis not present

## 2017-07-27 DIAGNOSIS — G20 Parkinson's disease: Secondary | ICD-10-CM | POA: Diagnosis not present

## 2017-07-27 DIAGNOSIS — I10 Essential (primary) hypertension: Secondary | ICD-10-CM | POA: Diagnosis not present

## 2017-07-30 DIAGNOSIS — R54 Age-related physical debility: Secondary | ICD-10-CM | POA: Diagnosis not present

## 2017-07-30 DIAGNOSIS — G3183 Dementia with Lewy bodies: Secondary | ICD-10-CM | POA: Diagnosis not present

## 2017-07-30 DIAGNOSIS — I10 Essential (primary) hypertension: Secondary | ICD-10-CM | POA: Diagnosis not present

## 2017-07-30 DIAGNOSIS — I679 Cerebrovascular disease, unspecified: Secondary | ICD-10-CM | POA: Diagnosis not present

## 2017-07-30 DIAGNOSIS — F339 Major depressive disorder, recurrent, unspecified: Secondary | ICD-10-CM | POA: Diagnosis not present

## 2017-07-30 DIAGNOSIS — G20 Parkinson's disease: Secondary | ICD-10-CM | POA: Diagnosis not present

## 2017-08-03 DIAGNOSIS — F339 Major depressive disorder, recurrent, unspecified: Secondary | ICD-10-CM | POA: Diagnosis not present

## 2017-08-03 DIAGNOSIS — I679 Cerebrovascular disease, unspecified: Secondary | ICD-10-CM | POA: Diagnosis not present

## 2017-08-03 DIAGNOSIS — I10 Essential (primary) hypertension: Secondary | ICD-10-CM | POA: Diagnosis not present

## 2017-08-03 DIAGNOSIS — R54 Age-related physical debility: Secondary | ICD-10-CM | POA: Diagnosis not present

## 2017-08-03 DIAGNOSIS — G20 Parkinson's disease: Secondary | ICD-10-CM | POA: Diagnosis not present

## 2017-08-03 DIAGNOSIS — G3183 Dementia with Lewy bodies: Secondary | ICD-10-CM | POA: Diagnosis not present

## 2017-08-06 DIAGNOSIS — G3183 Dementia with Lewy bodies: Secondary | ICD-10-CM | POA: Diagnosis not present

## 2017-08-06 DIAGNOSIS — G20 Parkinson's disease: Secondary | ICD-10-CM | POA: Diagnosis not present

## 2017-08-06 DIAGNOSIS — I10 Essential (primary) hypertension: Secondary | ICD-10-CM | POA: Diagnosis not present

## 2017-08-06 DIAGNOSIS — F339 Major depressive disorder, recurrent, unspecified: Secondary | ICD-10-CM | POA: Diagnosis not present

## 2017-08-06 DIAGNOSIS — R54 Age-related physical debility: Secondary | ICD-10-CM | POA: Diagnosis not present

## 2017-08-06 DIAGNOSIS — I679 Cerebrovascular disease, unspecified: Secondary | ICD-10-CM | POA: Diagnosis not present

## 2017-08-10 DIAGNOSIS — G20 Parkinson's disease: Secondary | ICD-10-CM | POA: Diagnosis not present

## 2017-08-10 DIAGNOSIS — I10 Essential (primary) hypertension: Secondary | ICD-10-CM | POA: Diagnosis not present

## 2017-08-10 DIAGNOSIS — G3183 Dementia with Lewy bodies: Secondary | ICD-10-CM | POA: Diagnosis not present

## 2017-08-10 DIAGNOSIS — I679 Cerebrovascular disease, unspecified: Secondary | ICD-10-CM | POA: Diagnosis not present

## 2017-08-10 DIAGNOSIS — F339 Major depressive disorder, recurrent, unspecified: Secondary | ICD-10-CM | POA: Diagnosis not present

## 2017-08-10 DIAGNOSIS — R54 Age-related physical debility: Secondary | ICD-10-CM | POA: Diagnosis not present

## 2017-08-11 DIAGNOSIS — G3183 Dementia with Lewy bodies: Secondary | ICD-10-CM | POA: Diagnosis not present

## 2017-08-11 DIAGNOSIS — G20 Parkinson's disease: Secondary | ICD-10-CM | POA: Diagnosis not present

## 2017-08-11 DIAGNOSIS — I10 Essential (primary) hypertension: Secondary | ICD-10-CM | POA: Diagnosis not present

## 2017-08-11 DIAGNOSIS — I679 Cerebrovascular disease, unspecified: Secondary | ICD-10-CM | POA: Diagnosis not present

## 2017-08-11 DIAGNOSIS — R54 Age-related physical debility: Secondary | ICD-10-CM | POA: Diagnosis not present

## 2017-08-11 DIAGNOSIS — F339 Major depressive disorder, recurrent, unspecified: Secondary | ICD-10-CM | POA: Diagnosis not present

## 2017-08-13 DIAGNOSIS — I679 Cerebrovascular disease, unspecified: Secondary | ICD-10-CM | POA: Diagnosis not present

## 2017-08-13 DIAGNOSIS — G3183 Dementia with Lewy bodies: Secondary | ICD-10-CM | POA: Diagnosis not present

## 2017-08-13 DIAGNOSIS — F339 Major depressive disorder, recurrent, unspecified: Secondary | ICD-10-CM | POA: Diagnosis not present

## 2017-08-13 DIAGNOSIS — G20 Parkinson's disease: Secondary | ICD-10-CM | POA: Diagnosis not present

## 2017-08-13 DIAGNOSIS — I10 Essential (primary) hypertension: Secondary | ICD-10-CM | POA: Diagnosis not present

## 2017-08-13 DIAGNOSIS — R54 Age-related physical debility: Secondary | ICD-10-CM | POA: Diagnosis not present

## 2017-08-16 DIAGNOSIS — I10 Essential (primary) hypertension: Secondary | ICD-10-CM | POA: Diagnosis not present

## 2017-08-16 DIAGNOSIS — F339 Major depressive disorder, recurrent, unspecified: Secondary | ICD-10-CM | POA: Diagnosis not present

## 2017-08-16 DIAGNOSIS — Z8546 Personal history of malignant neoplasm of prostate: Secondary | ICD-10-CM | POA: Diagnosis not present

## 2017-08-16 DIAGNOSIS — I679 Cerebrovascular disease, unspecified: Secondary | ICD-10-CM | POA: Diagnosis not present

## 2017-08-16 DIAGNOSIS — R54 Age-related physical debility: Secondary | ICD-10-CM | POA: Diagnosis not present

## 2017-08-16 DIAGNOSIS — G3183 Dementia with Lewy bodies: Secondary | ICD-10-CM | POA: Diagnosis not present

## 2017-08-16 DIAGNOSIS — G20 Parkinson's disease: Secondary | ICD-10-CM | POA: Diagnosis not present

## 2017-08-17 DIAGNOSIS — F339 Major depressive disorder, recurrent, unspecified: Secondary | ICD-10-CM | POA: Diagnosis not present

## 2017-08-17 DIAGNOSIS — R54 Age-related physical debility: Secondary | ICD-10-CM | POA: Diagnosis not present

## 2017-08-17 DIAGNOSIS — I10 Essential (primary) hypertension: Secondary | ICD-10-CM | POA: Diagnosis not present

## 2017-08-17 DIAGNOSIS — G20 Parkinson's disease: Secondary | ICD-10-CM | POA: Diagnosis not present

## 2017-08-17 DIAGNOSIS — I679 Cerebrovascular disease, unspecified: Secondary | ICD-10-CM | POA: Diagnosis not present

## 2017-08-17 DIAGNOSIS — G3183 Dementia with Lewy bodies: Secondary | ICD-10-CM | POA: Diagnosis not present

## 2017-08-20 DIAGNOSIS — I10 Essential (primary) hypertension: Secondary | ICD-10-CM | POA: Diagnosis not present

## 2017-08-20 DIAGNOSIS — I679 Cerebrovascular disease, unspecified: Secondary | ICD-10-CM | POA: Diagnosis not present

## 2017-08-20 DIAGNOSIS — R54 Age-related physical debility: Secondary | ICD-10-CM | POA: Diagnosis not present

## 2017-08-20 DIAGNOSIS — G20 Parkinson's disease: Secondary | ICD-10-CM | POA: Diagnosis not present

## 2017-08-20 DIAGNOSIS — G3183 Dementia with Lewy bodies: Secondary | ICD-10-CM | POA: Diagnosis not present

## 2017-08-20 DIAGNOSIS — F339 Major depressive disorder, recurrent, unspecified: Secondary | ICD-10-CM | POA: Diagnosis not present

## 2017-08-24 DIAGNOSIS — G3183 Dementia with Lewy bodies: Secondary | ICD-10-CM | POA: Diagnosis not present

## 2017-08-24 DIAGNOSIS — R54 Age-related physical debility: Secondary | ICD-10-CM | POA: Diagnosis not present

## 2017-08-24 DIAGNOSIS — I679 Cerebrovascular disease, unspecified: Secondary | ICD-10-CM | POA: Diagnosis not present

## 2017-08-24 DIAGNOSIS — I10 Essential (primary) hypertension: Secondary | ICD-10-CM | POA: Diagnosis not present

## 2017-08-24 DIAGNOSIS — F339 Major depressive disorder, recurrent, unspecified: Secondary | ICD-10-CM | POA: Diagnosis not present

## 2017-08-24 DIAGNOSIS — G20 Parkinson's disease: Secondary | ICD-10-CM | POA: Diagnosis not present

## 2017-08-27 DIAGNOSIS — R54 Age-related physical debility: Secondary | ICD-10-CM | POA: Diagnosis not present

## 2017-08-27 DIAGNOSIS — F339 Major depressive disorder, recurrent, unspecified: Secondary | ICD-10-CM | POA: Diagnosis not present

## 2017-08-27 DIAGNOSIS — I679 Cerebrovascular disease, unspecified: Secondary | ICD-10-CM | POA: Diagnosis not present

## 2017-08-27 DIAGNOSIS — G3183 Dementia with Lewy bodies: Secondary | ICD-10-CM | POA: Diagnosis not present

## 2017-08-27 DIAGNOSIS — I10 Essential (primary) hypertension: Secondary | ICD-10-CM | POA: Diagnosis not present

## 2017-08-27 DIAGNOSIS — G20 Parkinson's disease: Secondary | ICD-10-CM | POA: Diagnosis not present

## 2017-08-31 DIAGNOSIS — G3183 Dementia with Lewy bodies: Secondary | ICD-10-CM | POA: Diagnosis not present

## 2017-08-31 DIAGNOSIS — R54 Age-related physical debility: Secondary | ICD-10-CM | POA: Diagnosis not present

## 2017-08-31 DIAGNOSIS — G20 Parkinson's disease: Secondary | ICD-10-CM | POA: Diagnosis not present

## 2017-08-31 DIAGNOSIS — F339 Major depressive disorder, recurrent, unspecified: Secondary | ICD-10-CM | POA: Diagnosis not present

## 2017-08-31 DIAGNOSIS — I10 Essential (primary) hypertension: Secondary | ICD-10-CM | POA: Diagnosis not present

## 2017-08-31 DIAGNOSIS — I679 Cerebrovascular disease, unspecified: Secondary | ICD-10-CM | POA: Diagnosis not present

## 2017-09-03 DIAGNOSIS — R54 Age-related physical debility: Secondary | ICD-10-CM | POA: Diagnosis not present

## 2017-09-03 DIAGNOSIS — F339 Major depressive disorder, recurrent, unspecified: Secondary | ICD-10-CM | POA: Diagnosis not present

## 2017-09-03 DIAGNOSIS — G3183 Dementia with Lewy bodies: Secondary | ICD-10-CM | POA: Diagnosis not present

## 2017-09-03 DIAGNOSIS — I10 Essential (primary) hypertension: Secondary | ICD-10-CM | POA: Diagnosis not present

## 2017-09-03 DIAGNOSIS — G20 Parkinson's disease: Secondary | ICD-10-CM | POA: Diagnosis not present

## 2017-09-03 DIAGNOSIS — I679 Cerebrovascular disease, unspecified: Secondary | ICD-10-CM | POA: Diagnosis not present

## 2017-09-06 DIAGNOSIS — R54 Age-related physical debility: Secondary | ICD-10-CM | POA: Diagnosis not present

## 2017-09-06 DIAGNOSIS — G3183 Dementia with Lewy bodies: Secondary | ICD-10-CM | POA: Diagnosis not present

## 2017-09-06 DIAGNOSIS — I10 Essential (primary) hypertension: Secondary | ICD-10-CM | POA: Diagnosis not present

## 2017-09-06 DIAGNOSIS — I679 Cerebrovascular disease, unspecified: Secondary | ICD-10-CM | POA: Diagnosis not present

## 2017-09-06 DIAGNOSIS — G20 Parkinson's disease: Secondary | ICD-10-CM | POA: Diagnosis not present

## 2017-09-06 DIAGNOSIS — F339 Major depressive disorder, recurrent, unspecified: Secondary | ICD-10-CM | POA: Diagnosis not present

## 2017-09-07 DIAGNOSIS — G3183 Dementia with Lewy bodies: Secondary | ICD-10-CM | POA: Diagnosis not present

## 2017-09-07 DIAGNOSIS — G20 Parkinson's disease: Secondary | ICD-10-CM | POA: Diagnosis not present

## 2017-09-07 DIAGNOSIS — F339 Major depressive disorder, recurrent, unspecified: Secondary | ICD-10-CM | POA: Diagnosis not present

## 2017-09-07 DIAGNOSIS — R54 Age-related physical debility: Secondary | ICD-10-CM | POA: Diagnosis not present

## 2017-09-07 DIAGNOSIS — I10 Essential (primary) hypertension: Secondary | ICD-10-CM | POA: Diagnosis not present

## 2017-09-07 DIAGNOSIS — I679 Cerebrovascular disease, unspecified: Secondary | ICD-10-CM | POA: Diagnosis not present

## 2017-09-08 DIAGNOSIS — R54 Age-related physical debility: Secondary | ICD-10-CM | POA: Diagnosis not present

## 2017-09-08 DIAGNOSIS — G20 Parkinson's disease: Secondary | ICD-10-CM | POA: Diagnosis not present

## 2017-09-08 DIAGNOSIS — I679 Cerebrovascular disease, unspecified: Secondary | ICD-10-CM | POA: Diagnosis not present

## 2017-09-08 DIAGNOSIS — F339 Major depressive disorder, recurrent, unspecified: Secondary | ICD-10-CM | POA: Diagnosis not present

## 2017-09-08 DIAGNOSIS — G3183 Dementia with Lewy bodies: Secondary | ICD-10-CM | POA: Diagnosis not present

## 2017-09-08 DIAGNOSIS — I10 Essential (primary) hypertension: Secondary | ICD-10-CM | POA: Diagnosis not present

## 2017-09-10 DIAGNOSIS — I679 Cerebrovascular disease, unspecified: Secondary | ICD-10-CM | POA: Diagnosis not present

## 2017-09-10 DIAGNOSIS — R54 Age-related physical debility: Secondary | ICD-10-CM | POA: Diagnosis not present

## 2017-09-10 DIAGNOSIS — I10 Essential (primary) hypertension: Secondary | ICD-10-CM | POA: Diagnosis not present

## 2017-09-10 DIAGNOSIS — G3183 Dementia with Lewy bodies: Secondary | ICD-10-CM | POA: Diagnosis not present

## 2017-09-10 DIAGNOSIS — F339 Major depressive disorder, recurrent, unspecified: Secondary | ICD-10-CM | POA: Diagnosis not present

## 2017-09-10 DIAGNOSIS — G20 Parkinson's disease: Secondary | ICD-10-CM | POA: Diagnosis not present

## 2017-09-14 DIAGNOSIS — F339 Major depressive disorder, recurrent, unspecified: Secondary | ICD-10-CM | POA: Diagnosis not present

## 2017-09-14 DIAGNOSIS — G3183 Dementia with Lewy bodies: Secondary | ICD-10-CM | POA: Diagnosis not present

## 2017-09-14 DIAGNOSIS — G20 Parkinson's disease: Secondary | ICD-10-CM | POA: Diagnosis not present

## 2017-09-14 DIAGNOSIS — I679 Cerebrovascular disease, unspecified: Secondary | ICD-10-CM | POA: Diagnosis not present

## 2017-09-14 DIAGNOSIS — R54 Age-related physical debility: Secondary | ICD-10-CM | POA: Diagnosis not present

## 2017-09-14 DIAGNOSIS — I10 Essential (primary) hypertension: Secondary | ICD-10-CM | POA: Diagnosis not present

## 2017-09-16 DIAGNOSIS — G3183 Dementia with Lewy bodies: Secondary | ICD-10-CM | POA: Diagnosis not present

## 2017-09-16 DIAGNOSIS — Z8546 Personal history of malignant neoplasm of prostate: Secondary | ICD-10-CM | POA: Diagnosis not present

## 2017-09-16 DIAGNOSIS — F339 Major depressive disorder, recurrent, unspecified: Secondary | ICD-10-CM | POA: Diagnosis not present

## 2017-09-16 DIAGNOSIS — G20 Parkinson's disease: Secondary | ICD-10-CM | POA: Diagnosis not present

## 2017-09-16 DIAGNOSIS — R54 Age-related physical debility: Secondary | ICD-10-CM | POA: Diagnosis not present

## 2017-09-16 DIAGNOSIS — I679 Cerebrovascular disease, unspecified: Secondary | ICD-10-CM | POA: Diagnosis not present

## 2017-09-16 DIAGNOSIS — I10 Essential (primary) hypertension: Secondary | ICD-10-CM | POA: Diagnosis not present

## 2017-09-17 DIAGNOSIS — G3183 Dementia with Lewy bodies: Secondary | ICD-10-CM | POA: Diagnosis not present

## 2017-09-17 DIAGNOSIS — F339 Major depressive disorder, recurrent, unspecified: Secondary | ICD-10-CM | POA: Diagnosis not present

## 2017-09-17 DIAGNOSIS — G20 Parkinson's disease: Secondary | ICD-10-CM | POA: Diagnosis not present

## 2017-09-17 DIAGNOSIS — R54 Age-related physical debility: Secondary | ICD-10-CM | POA: Diagnosis not present

## 2017-09-17 DIAGNOSIS — I10 Essential (primary) hypertension: Secondary | ICD-10-CM | POA: Diagnosis not present

## 2017-09-17 DIAGNOSIS — I679 Cerebrovascular disease, unspecified: Secondary | ICD-10-CM | POA: Diagnosis not present

## 2017-09-24 DIAGNOSIS — I10 Essential (primary) hypertension: Secondary | ICD-10-CM | POA: Diagnosis not present

## 2017-09-24 DIAGNOSIS — R54 Age-related physical debility: Secondary | ICD-10-CM | POA: Diagnosis not present

## 2017-09-24 DIAGNOSIS — G20 Parkinson's disease: Secondary | ICD-10-CM | POA: Diagnosis not present

## 2017-09-24 DIAGNOSIS — G3183 Dementia with Lewy bodies: Secondary | ICD-10-CM | POA: Diagnosis not present

## 2017-09-24 DIAGNOSIS — F339 Major depressive disorder, recurrent, unspecified: Secondary | ICD-10-CM | POA: Diagnosis not present

## 2017-09-24 DIAGNOSIS — I679 Cerebrovascular disease, unspecified: Secondary | ICD-10-CM | POA: Diagnosis not present

## 2017-09-25 DIAGNOSIS — R54 Age-related physical debility: Secondary | ICD-10-CM | POA: Diagnosis not present

## 2017-09-25 DIAGNOSIS — I10 Essential (primary) hypertension: Secondary | ICD-10-CM | POA: Diagnosis not present

## 2017-09-25 DIAGNOSIS — I679 Cerebrovascular disease, unspecified: Secondary | ICD-10-CM | POA: Diagnosis not present

## 2017-09-25 DIAGNOSIS — F339 Major depressive disorder, recurrent, unspecified: Secondary | ICD-10-CM | POA: Diagnosis not present

## 2017-09-25 DIAGNOSIS — G20 Parkinson's disease: Secondary | ICD-10-CM | POA: Diagnosis not present

## 2017-09-25 DIAGNOSIS — G3183 Dementia with Lewy bodies: Secondary | ICD-10-CM | POA: Diagnosis not present

## 2017-09-28 DIAGNOSIS — R54 Age-related physical debility: Secondary | ICD-10-CM | POA: Diagnosis not present

## 2017-09-28 DIAGNOSIS — G20 Parkinson's disease: Secondary | ICD-10-CM | POA: Diagnosis not present

## 2017-09-28 DIAGNOSIS — F339 Major depressive disorder, recurrent, unspecified: Secondary | ICD-10-CM | POA: Diagnosis not present

## 2017-09-28 DIAGNOSIS — I679 Cerebrovascular disease, unspecified: Secondary | ICD-10-CM | POA: Diagnosis not present

## 2017-09-28 DIAGNOSIS — G3183 Dementia with Lewy bodies: Secondary | ICD-10-CM | POA: Diagnosis not present

## 2017-09-28 DIAGNOSIS — I10 Essential (primary) hypertension: Secondary | ICD-10-CM | POA: Diagnosis not present

## 2017-10-01 DIAGNOSIS — G3183 Dementia with Lewy bodies: Secondary | ICD-10-CM | POA: Diagnosis not present

## 2017-10-01 DIAGNOSIS — G20 Parkinson's disease: Secondary | ICD-10-CM | POA: Diagnosis not present

## 2017-10-01 DIAGNOSIS — I679 Cerebrovascular disease, unspecified: Secondary | ICD-10-CM | POA: Diagnosis not present

## 2017-10-01 DIAGNOSIS — F339 Major depressive disorder, recurrent, unspecified: Secondary | ICD-10-CM | POA: Diagnosis not present

## 2017-10-01 DIAGNOSIS — I10 Essential (primary) hypertension: Secondary | ICD-10-CM | POA: Diagnosis not present

## 2017-10-01 DIAGNOSIS — R54 Age-related physical debility: Secondary | ICD-10-CM | POA: Diagnosis not present

## 2017-10-05 DIAGNOSIS — G20 Parkinson's disease: Secondary | ICD-10-CM | POA: Diagnosis not present

## 2017-10-05 DIAGNOSIS — R54 Age-related physical debility: Secondary | ICD-10-CM | POA: Diagnosis not present

## 2017-10-05 DIAGNOSIS — G3183 Dementia with Lewy bodies: Secondary | ICD-10-CM | POA: Diagnosis not present

## 2017-10-05 DIAGNOSIS — I10 Essential (primary) hypertension: Secondary | ICD-10-CM | POA: Diagnosis not present

## 2017-10-05 DIAGNOSIS — I679 Cerebrovascular disease, unspecified: Secondary | ICD-10-CM | POA: Diagnosis not present

## 2017-10-05 DIAGNOSIS — F339 Major depressive disorder, recurrent, unspecified: Secondary | ICD-10-CM | POA: Diagnosis not present

## 2017-10-06 DIAGNOSIS — R54 Age-related physical debility: Secondary | ICD-10-CM | POA: Diagnosis not present

## 2017-10-06 DIAGNOSIS — I10 Essential (primary) hypertension: Secondary | ICD-10-CM | POA: Diagnosis not present

## 2017-10-06 DIAGNOSIS — G3183 Dementia with Lewy bodies: Secondary | ICD-10-CM | POA: Diagnosis not present

## 2017-10-06 DIAGNOSIS — I679 Cerebrovascular disease, unspecified: Secondary | ICD-10-CM | POA: Diagnosis not present

## 2017-10-06 DIAGNOSIS — G20 Parkinson's disease: Secondary | ICD-10-CM | POA: Diagnosis not present

## 2017-10-06 DIAGNOSIS — F339 Major depressive disorder, recurrent, unspecified: Secondary | ICD-10-CM | POA: Diagnosis not present

## 2017-10-08 DIAGNOSIS — I679 Cerebrovascular disease, unspecified: Secondary | ICD-10-CM | POA: Diagnosis not present

## 2017-10-08 DIAGNOSIS — I10 Essential (primary) hypertension: Secondary | ICD-10-CM | POA: Diagnosis not present

## 2017-10-08 DIAGNOSIS — F339 Major depressive disorder, recurrent, unspecified: Secondary | ICD-10-CM | POA: Diagnosis not present

## 2017-10-08 DIAGNOSIS — G20 Parkinson's disease: Secondary | ICD-10-CM | POA: Diagnosis not present

## 2017-10-08 DIAGNOSIS — R54 Age-related physical debility: Secondary | ICD-10-CM | POA: Diagnosis not present

## 2017-10-08 DIAGNOSIS — G3183 Dementia with Lewy bodies: Secondary | ICD-10-CM | POA: Diagnosis not present

## 2017-10-12 DIAGNOSIS — I679 Cerebrovascular disease, unspecified: Secondary | ICD-10-CM | POA: Diagnosis not present

## 2017-10-12 DIAGNOSIS — G20 Parkinson's disease: Secondary | ICD-10-CM | POA: Diagnosis not present

## 2017-10-12 DIAGNOSIS — R54 Age-related physical debility: Secondary | ICD-10-CM | POA: Diagnosis not present

## 2017-10-12 DIAGNOSIS — I10 Essential (primary) hypertension: Secondary | ICD-10-CM | POA: Diagnosis not present

## 2017-10-12 DIAGNOSIS — F339 Major depressive disorder, recurrent, unspecified: Secondary | ICD-10-CM | POA: Diagnosis not present

## 2017-10-12 DIAGNOSIS — G3183 Dementia with Lewy bodies: Secondary | ICD-10-CM | POA: Diagnosis not present

## 2017-10-13 DIAGNOSIS — F339 Major depressive disorder, recurrent, unspecified: Secondary | ICD-10-CM | POA: Diagnosis not present

## 2017-10-13 DIAGNOSIS — G20 Parkinson's disease: Secondary | ICD-10-CM | POA: Diagnosis not present

## 2017-10-13 DIAGNOSIS — I10 Essential (primary) hypertension: Secondary | ICD-10-CM | POA: Diagnosis not present

## 2017-10-13 DIAGNOSIS — R54 Age-related physical debility: Secondary | ICD-10-CM | POA: Diagnosis not present

## 2017-10-13 DIAGNOSIS — I679 Cerebrovascular disease, unspecified: Secondary | ICD-10-CM | POA: Diagnosis not present

## 2017-10-13 DIAGNOSIS — G3183 Dementia with Lewy bodies: Secondary | ICD-10-CM | POA: Diagnosis not present

## 2017-10-15 DIAGNOSIS — G20 Parkinson's disease: Secondary | ICD-10-CM | POA: Diagnosis not present

## 2017-10-15 DIAGNOSIS — F339 Major depressive disorder, recurrent, unspecified: Secondary | ICD-10-CM | POA: Diagnosis not present

## 2017-10-15 DIAGNOSIS — I679 Cerebrovascular disease, unspecified: Secondary | ICD-10-CM | POA: Diagnosis not present

## 2017-10-15 DIAGNOSIS — R54 Age-related physical debility: Secondary | ICD-10-CM | POA: Diagnosis not present

## 2017-10-15 DIAGNOSIS — G3183 Dementia with Lewy bodies: Secondary | ICD-10-CM | POA: Diagnosis not present

## 2017-10-15 DIAGNOSIS — I10 Essential (primary) hypertension: Secondary | ICD-10-CM | POA: Diagnosis not present

## 2017-10-17 DIAGNOSIS — Z8546 Personal history of malignant neoplasm of prostate: Secondary | ICD-10-CM | POA: Diagnosis not present

## 2017-10-17 DIAGNOSIS — I679 Cerebrovascular disease, unspecified: Secondary | ICD-10-CM | POA: Diagnosis not present

## 2017-10-17 DIAGNOSIS — G3183 Dementia with Lewy bodies: Secondary | ICD-10-CM | POA: Diagnosis not present

## 2017-10-17 DIAGNOSIS — G20 Parkinson's disease: Secondary | ICD-10-CM | POA: Diagnosis not present

## 2017-10-17 DIAGNOSIS — F339 Major depressive disorder, recurrent, unspecified: Secondary | ICD-10-CM | POA: Diagnosis not present

## 2017-10-17 DIAGNOSIS — R54 Age-related physical debility: Secondary | ICD-10-CM | POA: Diagnosis not present

## 2017-10-17 DIAGNOSIS — I10 Essential (primary) hypertension: Secondary | ICD-10-CM | POA: Diagnosis not present

## 2017-10-19 DIAGNOSIS — I679 Cerebrovascular disease, unspecified: Secondary | ICD-10-CM | POA: Diagnosis not present

## 2017-10-19 DIAGNOSIS — R54 Age-related physical debility: Secondary | ICD-10-CM | POA: Diagnosis not present

## 2017-10-19 DIAGNOSIS — I10 Essential (primary) hypertension: Secondary | ICD-10-CM | POA: Diagnosis not present

## 2017-10-19 DIAGNOSIS — G3183 Dementia with Lewy bodies: Secondary | ICD-10-CM | POA: Diagnosis not present

## 2017-10-19 DIAGNOSIS — F339 Major depressive disorder, recurrent, unspecified: Secondary | ICD-10-CM | POA: Diagnosis not present

## 2017-10-19 DIAGNOSIS — G20 Parkinson's disease: Secondary | ICD-10-CM | POA: Diagnosis not present

## 2017-10-21 DIAGNOSIS — F339 Major depressive disorder, recurrent, unspecified: Secondary | ICD-10-CM | POA: Diagnosis not present

## 2017-10-21 DIAGNOSIS — I10 Essential (primary) hypertension: Secondary | ICD-10-CM | POA: Diagnosis not present

## 2017-10-21 DIAGNOSIS — G20 Parkinson's disease: Secondary | ICD-10-CM | POA: Diagnosis not present

## 2017-10-21 DIAGNOSIS — G3183 Dementia with Lewy bodies: Secondary | ICD-10-CM | POA: Diagnosis not present

## 2017-10-21 DIAGNOSIS — R54 Age-related physical debility: Secondary | ICD-10-CM | POA: Diagnosis not present

## 2017-10-21 DIAGNOSIS — I679 Cerebrovascular disease, unspecified: Secondary | ICD-10-CM | POA: Diagnosis not present

## 2017-10-22 DIAGNOSIS — F339 Major depressive disorder, recurrent, unspecified: Secondary | ICD-10-CM | POA: Diagnosis not present

## 2017-10-22 DIAGNOSIS — I10 Essential (primary) hypertension: Secondary | ICD-10-CM | POA: Diagnosis not present

## 2017-10-22 DIAGNOSIS — R54 Age-related physical debility: Secondary | ICD-10-CM | POA: Diagnosis not present

## 2017-10-22 DIAGNOSIS — G20 Parkinson's disease: Secondary | ICD-10-CM | POA: Diagnosis not present

## 2017-10-22 DIAGNOSIS — G3183 Dementia with Lewy bodies: Secondary | ICD-10-CM | POA: Diagnosis not present

## 2017-10-22 DIAGNOSIS — I679 Cerebrovascular disease, unspecified: Secondary | ICD-10-CM | POA: Diagnosis not present

## 2017-10-26 DIAGNOSIS — F339 Major depressive disorder, recurrent, unspecified: Secondary | ICD-10-CM | POA: Diagnosis not present

## 2017-10-26 DIAGNOSIS — G3183 Dementia with Lewy bodies: Secondary | ICD-10-CM | POA: Diagnosis not present

## 2017-10-26 DIAGNOSIS — I10 Essential (primary) hypertension: Secondary | ICD-10-CM | POA: Diagnosis not present

## 2017-10-26 DIAGNOSIS — G20 Parkinson's disease: Secondary | ICD-10-CM | POA: Diagnosis not present

## 2017-10-26 DIAGNOSIS — I679 Cerebrovascular disease, unspecified: Secondary | ICD-10-CM | POA: Diagnosis not present

## 2017-10-26 DIAGNOSIS — R54 Age-related physical debility: Secondary | ICD-10-CM | POA: Diagnosis not present

## 2017-10-29 DIAGNOSIS — R54 Age-related physical debility: Secondary | ICD-10-CM | POA: Diagnosis not present

## 2017-10-29 DIAGNOSIS — I679 Cerebrovascular disease, unspecified: Secondary | ICD-10-CM | POA: Diagnosis not present

## 2017-10-29 DIAGNOSIS — I10 Essential (primary) hypertension: Secondary | ICD-10-CM | POA: Diagnosis not present

## 2017-10-29 DIAGNOSIS — F339 Major depressive disorder, recurrent, unspecified: Secondary | ICD-10-CM | POA: Diagnosis not present

## 2017-10-29 DIAGNOSIS — G3183 Dementia with Lewy bodies: Secondary | ICD-10-CM | POA: Diagnosis not present

## 2017-10-29 DIAGNOSIS — G20 Parkinson's disease: Secondary | ICD-10-CM | POA: Diagnosis not present

## 2017-11-02 DIAGNOSIS — I679 Cerebrovascular disease, unspecified: Secondary | ICD-10-CM | POA: Diagnosis not present

## 2017-11-02 DIAGNOSIS — I10 Essential (primary) hypertension: Secondary | ICD-10-CM | POA: Diagnosis not present

## 2017-11-02 DIAGNOSIS — G3183 Dementia with Lewy bodies: Secondary | ICD-10-CM | POA: Diagnosis not present

## 2017-11-02 DIAGNOSIS — R54 Age-related physical debility: Secondary | ICD-10-CM | POA: Diagnosis not present

## 2017-11-02 DIAGNOSIS — F339 Major depressive disorder, recurrent, unspecified: Secondary | ICD-10-CM | POA: Diagnosis not present

## 2017-11-02 DIAGNOSIS — G20 Parkinson's disease: Secondary | ICD-10-CM | POA: Diagnosis not present

## 2017-11-03 DIAGNOSIS — R54 Age-related physical debility: Secondary | ICD-10-CM | POA: Diagnosis not present

## 2017-11-03 DIAGNOSIS — I10 Essential (primary) hypertension: Secondary | ICD-10-CM | POA: Diagnosis not present

## 2017-11-03 DIAGNOSIS — G20 Parkinson's disease: Secondary | ICD-10-CM | POA: Diagnosis not present

## 2017-11-03 DIAGNOSIS — F339 Major depressive disorder, recurrent, unspecified: Secondary | ICD-10-CM | POA: Diagnosis not present

## 2017-11-03 DIAGNOSIS — G3183 Dementia with Lewy bodies: Secondary | ICD-10-CM | POA: Diagnosis not present

## 2017-11-03 DIAGNOSIS — I679 Cerebrovascular disease, unspecified: Secondary | ICD-10-CM | POA: Diagnosis not present

## 2017-11-04 DIAGNOSIS — I10 Essential (primary) hypertension: Secondary | ICD-10-CM | POA: Diagnosis not present

## 2017-11-04 DIAGNOSIS — I679 Cerebrovascular disease, unspecified: Secondary | ICD-10-CM | POA: Diagnosis not present

## 2017-11-04 DIAGNOSIS — R54 Age-related physical debility: Secondary | ICD-10-CM | POA: Diagnosis not present

## 2017-11-04 DIAGNOSIS — F339 Major depressive disorder, recurrent, unspecified: Secondary | ICD-10-CM | POA: Diagnosis not present

## 2017-11-04 DIAGNOSIS — G3183 Dementia with Lewy bodies: Secondary | ICD-10-CM | POA: Diagnosis not present

## 2017-11-04 DIAGNOSIS — G20 Parkinson's disease: Secondary | ICD-10-CM | POA: Diagnosis not present

## 2017-11-05 DIAGNOSIS — G20 Parkinson's disease: Secondary | ICD-10-CM | POA: Diagnosis not present

## 2017-11-05 DIAGNOSIS — R54 Age-related physical debility: Secondary | ICD-10-CM | POA: Diagnosis not present

## 2017-11-05 DIAGNOSIS — F339 Major depressive disorder, recurrent, unspecified: Secondary | ICD-10-CM | POA: Diagnosis not present

## 2017-11-05 DIAGNOSIS — I679 Cerebrovascular disease, unspecified: Secondary | ICD-10-CM | POA: Diagnosis not present

## 2017-11-05 DIAGNOSIS — I10 Essential (primary) hypertension: Secondary | ICD-10-CM | POA: Diagnosis not present

## 2017-11-05 DIAGNOSIS — G3183 Dementia with Lewy bodies: Secondary | ICD-10-CM | POA: Diagnosis not present

## 2017-11-09 DIAGNOSIS — F339 Major depressive disorder, recurrent, unspecified: Secondary | ICD-10-CM | POA: Diagnosis not present

## 2017-11-09 DIAGNOSIS — I679 Cerebrovascular disease, unspecified: Secondary | ICD-10-CM | POA: Diagnosis not present

## 2017-11-09 DIAGNOSIS — I10 Essential (primary) hypertension: Secondary | ICD-10-CM | POA: Diagnosis not present

## 2017-11-09 DIAGNOSIS — R54 Age-related physical debility: Secondary | ICD-10-CM | POA: Diagnosis not present

## 2017-11-09 DIAGNOSIS — G3183 Dementia with Lewy bodies: Secondary | ICD-10-CM | POA: Diagnosis not present

## 2017-11-09 DIAGNOSIS — G20 Parkinson's disease: Secondary | ICD-10-CM | POA: Diagnosis not present

## 2017-11-10 DIAGNOSIS — F339 Major depressive disorder, recurrent, unspecified: Secondary | ICD-10-CM | POA: Diagnosis not present

## 2017-11-10 DIAGNOSIS — I679 Cerebrovascular disease, unspecified: Secondary | ICD-10-CM | POA: Diagnosis not present

## 2017-11-10 DIAGNOSIS — I10 Essential (primary) hypertension: Secondary | ICD-10-CM | POA: Diagnosis not present

## 2017-11-10 DIAGNOSIS — G20 Parkinson's disease: Secondary | ICD-10-CM | POA: Diagnosis not present

## 2017-11-10 DIAGNOSIS — G3183 Dementia with Lewy bodies: Secondary | ICD-10-CM | POA: Diagnosis not present

## 2017-11-10 DIAGNOSIS — R54 Age-related physical debility: Secondary | ICD-10-CM | POA: Diagnosis not present

## 2017-11-12 DIAGNOSIS — I10 Essential (primary) hypertension: Secondary | ICD-10-CM | POA: Diagnosis not present

## 2017-11-12 DIAGNOSIS — F339 Major depressive disorder, recurrent, unspecified: Secondary | ICD-10-CM | POA: Diagnosis not present

## 2017-11-12 DIAGNOSIS — G3183 Dementia with Lewy bodies: Secondary | ICD-10-CM | POA: Diagnosis not present

## 2017-11-12 DIAGNOSIS — G20 Parkinson's disease: Secondary | ICD-10-CM | POA: Diagnosis not present

## 2017-11-12 DIAGNOSIS — R54 Age-related physical debility: Secondary | ICD-10-CM | POA: Diagnosis not present

## 2017-11-12 DIAGNOSIS — I679 Cerebrovascular disease, unspecified: Secondary | ICD-10-CM | POA: Diagnosis not present

## 2017-11-16 DIAGNOSIS — G20 Parkinson's disease: Secondary | ICD-10-CM | POA: Diagnosis not present

## 2017-11-16 DIAGNOSIS — I10 Essential (primary) hypertension: Secondary | ICD-10-CM | POA: Diagnosis not present

## 2017-11-16 DIAGNOSIS — R54 Age-related physical debility: Secondary | ICD-10-CM | POA: Diagnosis not present

## 2017-11-16 DIAGNOSIS — I679 Cerebrovascular disease, unspecified: Secondary | ICD-10-CM | POA: Diagnosis not present

## 2017-11-16 DIAGNOSIS — G3183 Dementia with Lewy bodies: Secondary | ICD-10-CM | POA: Diagnosis not present

## 2017-11-16 DIAGNOSIS — F339 Major depressive disorder, recurrent, unspecified: Secondary | ICD-10-CM | POA: Diagnosis not present

## 2017-11-16 DIAGNOSIS — Z8546 Personal history of malignant neoplasm of prostate: Secondary | ICD-10-CM | POA: Diagnosis not present

## 2017-11-19 DIAGNOSIS — I10 Essential (primary) hypertension: Secondary | ICD-10-CM | POA: Diagnosis not present

## 2017-11-19 DIAGNOSIS — G3183 Dementia with Lewy bodies: Secondary | ICD-10-CM | POA: Diagnosis not present

## 2017-11-19 DIAGNOSIS — F339 Major depressive disorder, recurrent, unspecified: Secondary | ICD-10-CM | POA: Diagnosis not present

## 2017-11-19 DIAGNOSIS — R54 Age-related physical debility: Secondary | ICD-10-CM | POA: Diagnosis not present

## 2017-11-19 DIAGNOSIS — I679 Cerebrovascular disease, unspecified: Secondary | ICD-10-CM | POA: Diagnosis not present

## 2017-11-19 DIAGNOSIS — G20 Parkinson's disease: Secondary | ICD-10-CM | POA: Diagnosis not present

## 2017-11-23 DIAGNOSIS — I679 Cerebrovascular disease, unspecified: Secondary | ICD-10-CM | POA: Diagnosis not present

## 2017-11-23 DIAGNOSIS — F339 Major depressive disorder, recurrent, unspecified: Secondary | ICD-10-CM | POA: Diagnosis not present

## 2017-11-23 DIAGNOSIS — G20 Parkinson's disease: Secondary | ICD-10-CM | POA: Diagnosis not present

## 2017-11-23 DIAGNOSIS — I10 Essential (primary) hypertension: Secondary | ICD-10-CM | POA: Diagnosis not present

## 2017-11-23 DIAGNOSIS — R54 Age-related physical debility: Secondary | ICD-10-CM | POA: Diagnosis not present

## 2017-11-23 DIAGNOSIS — Z23 Encounter for immunization: Secondary | ICD-10-CM | POA: Diagnosis not present

## 2017-11-23 DIAGNOSIS — G3183 Dementia with Lewy bodies: Secondary | ICD-10-CM | POA: Diagnosis not present

## 2017-11-25 DIAGNOSIS — G20 Parkinson's disease: Secondary | ICD-10-CM | POA: Diagnosis not present

## 2017-11-25 DIAGNOSIS — R54 Age-related physical debility: Secondary | ICD-10-CM | POA: Diagnosis not present

## 2017-11-25 DIAGNOSIS — F339 Major depressive disorder, recurrent, unspecified: Secondary | ICD-10-CM | POA: Diagnosis not present

## 2017-11-25 DIAGNOSIS — G3183 Dementia with Lewy bodies: Secondary | ICD-10-CM | POA: Diagnosis not present

## 2017-11-25 DIAGNOSIS — I10 Essential (primary) hypertension: Secondary | ICD-10-CM | POA: Diagnosis not present

## 2017-11-25 DIAGNOSIS — I679 Cerebrovascular disease, unspecified: Secondary | ICD-10-CM | POA: Diagnosis not present

## 2017-11-26 DIAGNOSIS — F339 Major depressive disorder, recurrent, unspecified: Secondary | ICD-10-CM | POA: Diagnosis not present

## 2017-11-26 DIAGNOSIS — G20 Parkinson's disease: Secondary | ICD-10-CM | POA: Diagnosis not present

## 2017-11-26 DIAGNOSIS — G3183 Dementia with Lewy bodies: Secondary | ICD-10-CM | POA: Diagnosis not present

## 2017-11-26 DIAGNOSIS — R54 Age-related physical debility: Secondary | ICD-10-CM | POA: Diagnosis not present

## 2017-11-26 DIAGNOSIS — I10 Essential (primary) hypertension: Secondary | ICD-10-CM | POA: Diagnosis not present

## 2017-11-26 DIAGNOSIS — I679 Cerebrovascular disease, unspecified: Secondary | ICD-10-CM | POA: Diagnosis not present

## 2017-11-30 DIAGNOSIS — I10 Essential (primary) hypertension: Secondary | ICD-10-CM | POA: Diagnosis not present

## 2017-11-30 DIAGNOSIS — G20 Parkinson's disease: Secondary | ICD-10-CM | POA: Diagnosis not present

## 2017-11-30 DIAGNOSIS — I679 Cerebrovascular disease, unspecified: Secondary | ICD-10-CM | POA: Diagnosis not present

## 2017-11-30 DIAGNOSIS — R54 Age-related physical debility: Secondary | ICD-10-CM | POA: Diagnosis not present

## 2017-11-30 DIAGNOSIS — G3183 Dementia with Lewy bodies: Secondary | ICD-10-CM | POA: Diagnosis not present

## 2017-11-30 DIAGNOSIS — F339 Major depressive disorder, recurrent, unspecified: Secondary | ICD-10-CM | POA: Diagnosis not present

## 2017-12-01 DIAGNOSIS — G3183 Dementia with Lewy bodies: Secondary | ICD-10-CM | POA: Diagnosis not present

## 2017-12-01 DIAGNOSIS — I679 Cerebrovascular disease, unspecified: Secondary | ICD-10-CM | POA: Diagnosis not present

## 2017-12-01 DIAGNOSIS — F339 Major depressive disorder, recurrent, unspecified: Secondary | ICD-10-CM | POA: Diagnosis not present

## 2017-12-01 DIAGNOSIS — G20 Parkinson's disease: Secondary | ICD-10-CM | POA: Diagnosis not present

## 2017-12-01 DIAGNOSIS — R54 Age-related physical debility: Secondary | ICD-10-CM | POA: Diagnosis not present

## 2017-12-01 DIAGNOSIS — I10 Essential (primary) hypertension: Secondary | ICD-10-CM | POA: Diagnosis not present

## 2017-12-03 DIAGNOSIS — I10 Essential (primary) hypertension: Secondary | ICD-10-CM | POA: Diagnosis not present

## 2017-12-03 DIAGNOSIS — I679 Cerebrovascular disease, unspecified: Secondary | ICD-10-CM | POA: Diagnosis not present

## 2017-12-03 DIAGNOSIS — G20 Parkinson's disease: Secondary | ICD-10-CM | POA: Diagnosis not present

## 2017-12-03 DIAGNOSIS — F339 Major depressive disorder, recurrent, unspecified: Secondary | ICD-10-CM | POA: Diagnosis not present

## 2017-12-03 DIAGNOSIS — R54 Age-related physical debility: Secondary | ICD-10-CM | POA: Diagnosis not present

## 2017-12-03 DIAGNOSIS — G3183 Dementia with Lewy bodies: Secondary | ICD-10-CM | POA: Diagnosis not present

## 2017-12-07 DIAGNOSIS — G20 Parkinson's disease: Secondary | ICD-10-CM | POA: Diagnosis not present

## 2017-12-07 DIAGNOSIS — I679 Cerebrovascular disease, unspecified: Secondary | ICD-10-CM | POA: Diagnosis not present

## 2017-12-07 DIAGNOSIS — G3183 Dementia with Lewy bodies: Secondary | ICD-10-CM | POA: Diagnosis not present

## 2017-12-07 DIAGNOSIS — F339 Major depressive disorder, recurrent, unspecified: Secondary | ICD-10-CM | POA: Diagnosis not present

## 2017-12-07 DIAGNOSIS — R54 Age-related physical debility: Secondary | ICD-10-CM | POA: Diagnosis not present

## 2017-12-07 DIAGNOSIS — I10 Essential (primary) hypertension: Secondary | ICD-10-CM | POA: Diagnosis not present

## 2017-12-10 DIAGNOSIS — R54 Age-related physical debility: Secondary | ICD-10-CM | POA: Diagnosis not present

## 2017-12-10 DIAGNOSIS — G3183 Dementia with Lewy bodies: Secondary | ICD-10-CM | POA: Diagnosis not present

## 2017-12-10 DIAGNOSIS — I679 Cerebrovascular disease, unspecified: Secondary | ICD-10-CM | POA: Diagnosis not present

## 2017-12-10 DIAGNOSIS — F339 Major depressive disorder, recurrent, unspecified: Secondary | ICD-10-CM | POA: Diagnosis not present

## 2017-12-10 DIAGNOSIS — I10 Essential (primary) hypertension: Secondary | ICD-10-CM | POA: Diagnosis not present

## 2017-12-10 DIAGNOSIS — G20 Parkinson's disease: Secondary | ICD-10-CM | POA: Diagnosis not present

## 2017-12-14 DIAGNOSIS — R54 Age-related physical debility: Secondary | ICD-10-CM | POA: Diagnosis not present

## 2017-12-14 DIAGNOSIS — G20 Parkinson's disease: Secondary | ICD-10-CM | POA: Diagnosis not present

## 2017-12-14 DIAGNOSIS — I679 Cerebrovascular disease, unspecified: Secondary | ICD-10-CM | POA: Diagnosis not present

## 2017-12-14 DIAGNOSIS — I10 Essential (primary) hypertension: Secondary | ICD-10-CM | POA: Diagnosis not present

## 2017-12-14 DIAGNOSIS — G3183 Dementia with Lewy bodies: Secondary | ICD-10-CM | POA: Diagnosis not present

## 2017-12-14 DIAGNOSIS — F339 Major depressive disorder, recurrent, unspecified: Secondary | ICD-10-CM | POA: Diagnosis not present

## 2017-12-16 DIAGNOSIS — I10 Essential (primary) hypertension: Secondary | ICD-10-CM | POA: Diagnosis not present

## 2017-12-16 DIAGNOSIS — R54 Age-related physical debility: Secondary | ICD-10-CM | POA: Diagnosis not present

## 2017-12-16 DIAGNOSIS — I679 Cerebrovascular disease, unspecified: Secondary | ICD-10-CM | POA: Diagnosis not present

## 2017-12-16 DIAGNOSIS — G20 Parkinson's disease: Secondary | ICD-10-CM | POA: Diagnosis not present

## 2017-12-16 DIAGNOSIS — G3183 Dementia with Lewy bodies: Secondary | ICD-10-CM | POA: Diagnosis not present

## 2017-12-16 DIAGNOSIS — F339 Major depressive disorder, recurrent, unspecified: Secondary | ICD-10-CM | POA: Diagnosis not present

## 2017-12-17 DIAGNOSIS — I679 Cerebrovascular disease, unspecified: Secondary | ICD-10-CM | POA: Diagnosis not present

## 2017-12-17 DIAGNOSIS — F339 Major depressive disorder, recurrent, unspecified: Secondary | ICD-10-CM | POA: Diagnosis not present

## 2017-12-17 DIAGNOSIS — G20 Parkinson's disease: Secondary | ICD-10-CM | POA: Diagnosis not present

## 2017-12-17 DIAGNOSIS — G3183 Dementia with Lewy bodies: Secondary | ICD-10-CM | POA: Diagnosis not present

## 2017-12-17 DIAGNOSIS — R54 Age-related physical debility: Secondary | ICD-10-CM | POA: Diagnosis not present

## 2017-12-17 DIAGNOSIS — I10 Essential (primary) hypertension: Secondary | ICD-10-CM | POA: Diagnosis not present

## 2017-12-17 DIAGNOSIS — Z8546 Personal history of malignant neoplasm of prostate: Secondary | ICD-10-CM | POA: Diagnosis not present

## 2018-01-02 ENCOUNTER — Emergency Department (HOSPITAL_COMMUNITY)

## 2018-01-02 ENCOUNTER — Other Ambulatory Visit: Payer: Self-pay

## 2018-01-02 ENCOUNTER — Encounter (HOSPITAL_COMMUNITY): Payer: Self-pay

## 2018-01-02 ENCOUNTER — Emergency Department (HOSPITAL_COMMUNITY)
Admission: EM | Admit: 2018-01-02 | Discharge: 2018-01-02 | Disposition: A | Discharge status: Home / Self Care | Attending: Emergency Medicine | Admitting: Emergency Medicine

## 2018-01-02 DIAGNOSIS — Z7902 Long term (current) use of antithrombotics/antiplatelets: Secondary | ICD-10-CM | POA: Insufficient documentation

## 2018-01-02 DIAGNOSIS — I10 Essential (primary) hypertension: Secondary | ICD-10-CM | POA: Diagnosis not present

## 2018-01-02 DIAGNOSIS — I639 Cerebral infarction, unspecified: Secondary | ICD-10-CM | POA: Diagnosis not present

## 2018-01-02 DIAGNOSIS — Z8546 Personal history of malignant neoplasm of prostate: Secondary | ICD-10-CM | POA: Insufficient documentation

## 2018-01-02 DIAGNOSIS — R4182 Altered mental status, unspecified: Secondary | ICD-10-CM | POA: Diagnosis not present

## 2018-01-02 DIAGNOSIS — R29818 Other symptoms and signs involving the nervous system: Secondary | ICD-10-CM | POA: Diagnosis not present

## 2018-01-02 DIAGNOSIS — Z8673 Personal history of transient ischemic attack (TIA), and cerebral infarction without residual deficits: Secondary | ICD-10-CM | POA: Diagnosis not present

## 2018-01-02 DIAGNOSIS — R531 Weakness: Secondary | ICD-10-CM | POA: Insufficient documentation

## 2018-01-02 DIAGNOSIS — G20 Parkinson's disease: Secondary | ICD-10-CM | POA: Diagnosis not present

## 2018-01-02 LAB — COMPREHENSIVE METABOLIC PANEL
ALBUMIN: 3.6 g/dL (ref 3.5–5.0)
ALT: <5 U/L (ref 0–44)
ANION GAP: 8 (ref 5–15)
AST: 44 U/L — ABNORMAL HIGH (ref 15–41)
Alkaline Phosphatase: 53 U/L (ref 38–126)
BUN: 23 mg/dL (ref 8–23)
CO2: 24 mmol/L (ref 22–32)
Calcium: 8.9 mg/dL (ref 8.9–10.3)
Chloride: 102 mmol/L (ref 98–111)
Creatinine, Ser: 1.14 mg/dL (ref 0.61–1.24)
GFR calc non Af Amer: 53 mL/min — ABNORMAL LOW (ref >60)
GLUCOSE: 112 mg/dL — AB (ref 70–99)
POTASSIUM: 5.4 mmol/L — AB (ref 3.5–5.1)
Sodium: 134 mmol/L — ABNORMAL LOW (ref 135–145)
Total Bilirubin: 1.5 mg/dL — ABNORMAL HIGH (ref 0.3–1.2)
Total Protein: 6.5 g/dL (ref 6.5–8.1)

## 2018-01-02 LAB — I-STAT CHEM 8, ED
BUN: 37 mg/dL — AB (ref 8–23)
CHLORIDE: 103 mmol/L (ref 98–111)
Calcium, Ion: 1.07 mmol/L — ABNORMAL LOW (ref 1.15–1.40)
Creatinine, Ser: 1.2 mg/dL (ref 0.61–1.24)
Glucose, Bld: 109 mg/dL — ABNORMAL HIGH (ref 70–99)
HEMATOCRIT: 46 % (ref 39.0–52.0)
HEMOGLOBIN: 15.6 g/dL (ref 13.0–17.0)
Potassium: 5.6 mmol/L — ABNORMAL HIGH (ref 3.5–5.1)
SODIUM: 136 mmol/L (ref 135–145)
TCO2: 27 mmol/L (ref 22–32)

## 2018-01-02 LAB — DIFFERENTIAL
Abs Immature Granulocytes: 0.04 10*3/uL (ref 0.00–0.07)
BASOS ABS: 0 10*3/uL (ref 0.0–0.1)
BASOS PCT: 0 %
EOS PCT: 3 %
Eosinophils Absolute: 0.2 10*3/uL (ref 0.0–0.5)
IMMATURE GRANULOCYTES: 1 %
Lymphocytes Relative: 14 %
Lymphs Abs: 1.1 10*3/uL (ref 0.7–4.0)
Monocytes Absolute: 0.7 10*3/uL (ref 0.1–1.0)
Monocytes Relative: 9 %
NEUTROS PCT: 73 %
Neutro Abs: 5.9 10*3/uL (ref 1.7–7.7)

## 2018-01-02 LAB — CBC
HEMATOCRIT: 46.1 % (ref 39.0–52.0)
Hemoglobin: 15.3 g/dL (ref 13.0–17.0)
MCH: 33.3 pg (ref 26.0–34.0)
MCHC: 33.2 g/dL (ref 30.0–36.0)
MCV: 100.4 fL — ABNORMAL HIGH (ref 80.0–100.0)
NRBC: 0 % (ref 0.0–0.2)
Platelets: 211 10*3/uL (ref 150–400)
RBC: 4.59 MIL/uL (ref 4.22–5.81)
RDW: 12.3 % (ref 11.5–15.5)
WBC: 8.1 10*3/uL (ref 4.0–10.5)

## 2018-01-02 LAB — I-STAT TROPONIN, ED: Troponin i, poc: 0.04 ng/mL (ref 0.00–0.08)

## 2018-01-02 LAB — PROTIME-INR
INR: 1.05
PROTHROMBIN TIME: 13.6 s (ref 11.4–15.2)

## 2018-01-02 LAB — APTT: APTT: 29 s (ref 24–36)

## 2018-01-02 MED ORDER — SODIUM CHLORIDE 0.9 % IV BOLUS
1000.0000 mL | Freq: Once | INTRAVENOUS | Status: AC
  Administered 2018-01-02: 1000 mL via INTRAVENOUS

## 2018-01-02 NOTE — ED Provider Notes (Signed)
Blood pressure (!) 180/72, pulse 70, temperature 98.1 F (36.7 C), temperature source Oral, resp. rate (!) 28, SpO2 96 %.  Assuming care from Dr. Gilford Raid.  In short, Ricardo [name] is a 84 y.o. male with a chief complaint of Code Stroke .  Refer to the original H&P for additional details.  The current plan of care is to f/u CXR and UA.   4:57 PM Evaluated the patient.  Family states that he is looking significantly better after IV fluids and they are ready to be discharged.  The patient is on hospice.  He has not been able to give a urine sample as of yet.  I discussed this with the family and may do not wish to wait for this result.  The patient's mental status has significantly improved.  He is not having fever or concern for developing sepsis.  Plan for discharge home with PCP follow-up.       Margette Fast, MD 01/02/18 [phone]

## 2018-01-02 NOTE — Consult Note (Addendum)
NEURO HOSPITALIST  CONSULT   Requesting Physician: Dr. Laverta Baltimore   Chief Complaint: generalized weakness but greater on right, right facial droop  History obtained from: EMS/patient  HPI:                                                                                                                                         Ricardo [name] is an 84 y.o. male  With PMH significant for stroke, prostate CA, parkinson's,  Polio affecting right leg ( since age 38), HTN who presented to [facility] as a code stroke for right facial droop and weakness.   Patient lives at [facility]. LSN 1300 while eating lunch. when they noticed that he has generalized weakness at baseline, but his right side seemed weaker than the left. Also noticed a facial droop. Per facility. Patient is incontinent and is mostly wheelchair bound. Per chart review patient is a hospice patient.  ED course:  CTH: no hemorrhage BG:134, BP: 155/70 Potassium: 5.6, BUN: 37  Date last known well: Date: 01/02/2018 Time last known well: Time: 13:00 tPA Given: No: MRS: 4 Modified Rankin: Rankin Score=4 NIHSS:11 Past Medical History:  Diagnosis Date  . Cancer ([place])   . Depression   . Gait disorder   . Hypertension   . Polio   . Prostate CA ([place])   . Stroke [facility])     Past Surgical History:  Procedure Laterality Date  . KNEE SURGERY      Family History  Problem Relation Age of Onset  . Heart disease Mother   . Heart disease Father   . Heart disease Grandchild          Social History:  reports that he has never smoked. He has never used smokeless tobacco. He reports that he drinks alcohol. He reports that he does not use drugs.  Allergies:  Allergies  Allergen Reactions  . Naprosyn [Naproxen] Other (See Comments)    Pt states "I just seems to go for a nose dive when I take it"  . Oxycontin [Oxycodone] Other (See Comments)    On Mar  . Penicillins Other (See Comments)     unknown  . Quinine Derivatives Other (See Comments)    Pt doesn't remember but listed on MAR from facility  . Streptomycin Other (See Comments)    Listed on patient's MAR from facility  . Sulfa Antibiotics Other (See Comments)    unknown    Medications:  Current Facility-Administered Medications  Medication Dose Route Frequency Provider Last Rate Last Dose  . sodium chloride 0.9 % bolus 1,000 mL  1,000 mL Intravenous Once Isla Pence, MD 983.6 mL/hr at 01/02/18 1525 1,000 mL at 01/02/18 1525   Current Outpatient Medications  Medication Sig Dispense Refill  . acetaminophen (TYLENOL) 500 MG tablet Take 500 mg by mouth every 6 (six) hours as needed for mild pain.    . Camphor-Menthol-Methyl Sal 1.2-5.7-6.3 % PTCH Apply 1 patch topically every morning. Right shoulder every morning and remove at bedtime    . carbidopa-levodopa (SINEMET IR) 25-100 MG per tablet Take 1 tablet by mouth 3 (three) times daily. At 9am, 2pm, and 8pm    . Cholecalciferol (VITAMIN D3) 2000 UNITS TABS Take 2,000 Units by mouth daily.     . citalopram (CELEXA) 20 MG tablet Take 20 mg by mouth daily.     . clopidogrel (PLAVIX) 75 MG tablet Take 75 mg by mouth daily.     . Emollient (MOISTURIZING LOTION EX) Apply 1 application topically 2 (two) times daily.    Marland Kitchen ENSURE PLUS (ENSURE PLUS) LIQD Take 0.5 Cans by mouth 2 (two) times daily.    Marland Kitchen HYDROcodone-acetaminophen (NORCO/VICODIN) 5-325 MG per tablet Take 1 tablet by mouth every 6 (six) hours as needed for moderate pain.    Marland Kitchen LORazepam (ATIVAN) 0.5 MG tablet Take 0.5 mg by mouth See admin instructions. Takes 0.5 mg as needed for anxiety throughout the day and takes 0.5 mg every night for sleep    . triamcinolone (KENALOG) 0.025 % cream Apply 1 application topically daily. Apply to groin area        ROS:                                                                                                                                        ROS was performed and is negative except as noted in HPI    General Examination:                                                                                                      Blood pressure (!) 188/69, pulse 67, temperature 98.1 F (36.7 C), temperature source Oral, resp. rate (!) 22, SpO2 97 %.  HEENT-  Normocephalic, no lesions, without obvious abnormality.  Normal external eye and conjunctiva.  Cardiovascular- S1-S2 audible, pulses palpable throughout   Lungs-no rhonchi or wheezing noted, no excessive working breathing.  Saturations within normal limits Abdomen- All  4 quadrants palpated and nontender Extremities- Warm, dry and intact Musculoskeletal-no joint tenderness, deformity or swelling Skin-warm and dry, no hyperpigmentation, vitiligo, or suspicious lesions  Neurological Examination Mental Status: Alert, oriented x 2, slow to answer questions. thought content appropriate.  Speech fluent without evidence of aphasia.  Able to follow  commands slowly Cranial Nerves: QI:HKVQQV fields grossly normal,  III,IV, VI: ptosis not present, extra-ocular motions intact bilaterally, pupils equal, round, reactive to light and accommodation V,VII: smile asymmetric, slight facial droop, facial light touch sensation normal bilaterally VIII: hearing normal bilaterally IX,X: uvula rises symmetrically XI: bilateral shoulder shrug XII: midline tongue extension Motor: Right : Upper extremity   4/5  Left:     Upper extremity   4/5  Lower extremity   1/5   Lower extremity   1/5 Cogwheel rigidity noted in BUE, patient left arm had pill rolling tremor during exam. Sensory: light touch intact throughout, bilaterally Plantars: Right: downgoing   Left: downgoing Cerebellar: No obvious ataxia, unable to perform HTS Gait: deferred   Lab Results: Basic Metabolic Panel: Recent Labs  Lab 01/02/18 1445  01/02/18 1447  NA 134* 136  K 5.4* 5.6*  CL 102 103  CO2 24  --   GLUCOSE 112* 109*  BUN 23 37*  CREATININE 1.14 1.20  CALCIUM 8.9  --     CBC: Recent Labs  Lab 01/02/18 1445 01/02/18 1447  WBC 8.1  --   NEUTROABS 5.9  --   HGB 15.3 15.6  HCT 46.1 46.0  MCV 100.4*  --   PLT 211  --     Imaging: Ct Head Code Stroke Wo Contrast  Result Date: 01/02/2018 CLINICAL DATA:  Code stroke.  Right-sided deficits. EXAM: CT HEAD WITHOUT CONTRAST TECHNIQUE: Contiguous axial images were obtained from the base of the skull through the vertex without intravenous contrast. COMPARISON:  02/04/2015 and 01/05/2015 FINDINGS: Brain: No acute large territory infarct, intracranial hemorrhage, mass, midline shift, or extra-axial fluid collection is identified. Multiple lacunar infarcts are present in the basal ganglia and thalami bilaterally. These lacune superior more conspicuous/more numerous compared to the 02/04/2015 study though do not appear as difference when compared to the 01/05/2015 study although it is difficult to exclude the presence of new lacunar infarcts, particularly in the right thalamus and right basal ganglia. Patchy to confluent cerebral white matter hypodensities have progressed and are nonspecific but compatible with moderately extensive chronic small vessel ischemic disease. There is moderate cerebral atrophy. Vascular: Calcified atherosclerosis at the skull base. Generalized intracranial arterial ectasia without suspicious focal vessel hyperdensity. Skull: No fracture or focal osseous lesion. Partially visualized severe left atlantooccipital arthropathy. Sinuses/Orbits: Visualized paranasal sinuses and mastoid air cells are clear. Bilateral cataract extraction is noted. Other: None. ASPECTS [facility] Stroke Program Early CT Score) - Ganglionic level infarction (caudate, lentiform nuclei, internal capsule, insula, M1-M3 cortex): 7 - Supraganglionic infarction (M4-M6 cortex): 3 Total score  (0-10 with 10 being normal): 10 IMPRESSION: 1. No evidence of acute cortically based infarct or intracranial hemorrhage. 2. With regards to the left MCA territory, ASPECTS is 10 (assuming the left basal ganglia infarcts are all chronic). 3. Numerous lacunar infarcts in the basal ganglia and thalami bilaterally, likely mildly progressed from 2016 more so on the right. A superimposed acute lacunar is not excluded. 4. Moderately extensive chronic small vessel ischemic disease. These results were communicated to Dr. Lorraine Lax at 3:02 pm on 01/02/2018 by text page via the [facility] messaging system. Electronically Signed   By: Logan Bores  M.D.   On: 01/02/2018 15:06   Laurey Morale, MSN, NP-C [facility] [phone]  01/02/2018, 3:50 PM   Attending physician note to follow with Assessment and plan .   Assessment: 84 y.o. male male  With PMH significant for stroke, prostate CA, parkinsons,  Polio affecting right leg ( since age 68), HTN who presented to [facility] as a code stroke for right facial droop and weakness. CTH: negative for hemorrhage. TPA not given for MRS: 4. Further stroke work up needed.   Impression: stroke Stroke Risk Factors - hypertension    Recommendations: -- BP goal : Permissive HTN upto 220/110 mmHg  -- MRI Brain -- Continue Plavix -- High intensity Statin if LDL > 70 -- HgbA1c, fasting lipid panel -- PT consult, OT consult, Speech consult --Telemetry monitoring --Frequent neuro checks --Stroke swallow screen  --please page stroke NP  Or  PA  Or MD from 8am -4 pm  as this patient from this time will be  followed by the stroke.   You can look them up on [email or web address]  Password [email or web address]    NEUROHOSPITALIST ADDENDUM Performed a face to face diagnostic evaluation.   I have reviewed the contents of history and physical exam as documented by PA/ARNP/Resident and agree with above documentation.  I have discussed and formulated the above plan as documented. Edits to the  note have been made as needed.  Impression: 84 year old patient who is wheelchair-bound, Parkinson's, polio affecting right leg presents with slurred speech, right facial droop and mild right upper extremity weakness.  Has known right leg weakness. Not a candidate for TPA due to poor baseline MRIs and would likely be more harmful than beneficial.  Plan: Recommend admission and MRI brain to evaluate if patient did have a stroke.  Continue Plavix for now.    Karena Addison Aroor MD [facility] [phone]   If 7pm to 7am, please call on call as listed on AMION.

## 2018-01-02 NOTE — ED Notes (Signed)
Patient transported to X-ray 

## 2018-01-02 NOTE — ED Provider Notes (Signed)
[facility] EMERGENCY DEPARTMENT Provider Note   CSN: [id number] Arrival date & time: 01/02/18  1439     History   Chief Complaint Chief Complaint  Patient presents with  . Code Stroke    HPI Amiir Heckard is a 84 y.o. male.  Pt presents to the ED today as a code stroke.  Pt is a hospice pt with a hx of parkinson's disease, prior stroke, prostate cancer, and polio affecting right leg.  Pt was eating at the facility and they noticed right leg weakness.  (Chronic since age 89 according to old chart.)  The pt denies any pain, but said he does not feel good.     Past Medical History:  Diagnosis Date  . Cancer ([place])   . Depression   . Gait disorder   . Hypertension   . Polio   . Prostate CA ([place])   . Stroke [facility])     Patient Active Problem List   Diagnosis Date Noted  . Gait difficulty 09/12/2012  . Parkinsonism ([place]) 09/12/2012    Past Surgical History:  Procedure Laterality Date  . KNEE SURGERY          Home Medications    Prior to Admission medications   Medication Sig Start Date End Date Taking? Authorizing Provider  acetaminophen (TYLENOL) 500 MG tablet Take 500 mg by mouth every 6 (six) hours as needed for mild pain.   Yes [provider]  Camphor-Menthol-Methyl Sal 1.2-5.7-6.3 % PTCH Apply 1 patch topically every morning. Right shoulder every morning and remove at bedtime   Yes [provider]  carbidopa-levodopa (SINEMET IR) 25-100 MG per tablet Take 1 tablet by mouth 3 (three) times daily. At 9am, 2pm, and 8pm 09/12/12  Yes Penumalli, Earlean Polka, MD  Cholecalciferol (VITAMIN D3) 2000 UNITS TABS Take 2,000 Units by mouth daily.    Yes [provider]  citalopram (CELEXA) 20 MG tablet Take 20 mg by mouth daily.  12/12/14  Yes [provider]  clopidogrel (PLAVIX) 75 MG tablet Take 75 mg by mouth daily.  08/23/12  Yes [provider]  Emollient (MOISTURIZING LOTION EX) Apply 1 application topically  2 (two) times daily.   Yes [provider]  ENSURE PLUS (ENSURE PLUS) LIQD Take 0.5 Cans by mouth 2 (two) times daily.   Yes [provider]  HYDROcodone-acetaminophen (NORCO/VICODIN) 5-325 MG per tablet Take 1 tablet by mouth every 6 (six) hours as needed for moderate pain.   Yes [provider]  LORazepam (ATIVAN) 0.5 MG tablet Take 0.5 mg by mouth See admin instructions. Takes 0.5 mg as needed for anxiety throughout the day and takes 0.5 mg every night for sleep   Yes [provider]  triamcinolone (KENALOG) 0.025 % cream Apply 1 application topically daily. Apply to groin area    Yes [provider]    Family History Family History  Problem Relation Age of Onset  . Heart disease Mother   . Heart disease Father   . Heart disease Grandchild     Social History Social History   Tobacco Use  . Smoking status: Never Smoker  . Smokeless tobacco: Never Used  Substance Use Topics  . Alcohol use: Yes    Comment: social  . Drug use: No     Allergies   Naprosyn [naproxen]; Oxycontin [oxycodone]; Penicillins; Quinine derivatives; Streptomycin; and Sulfa antibiotics   Review of Systems Review of Systems  Neurological:       Right leg  weakness  All other systems reviewed and are negative.    Physical Exam Updated Vital Signs BP (!) 188/69   Pulse 67   Temp 98.1 F (36.7 C) (Oral)   Resp (!) 22   SpO2 97%   Physical Exam  Constitutional: He is oriented to person, place, and time. He appears well-developed and well-nourished.  HENT:  Head: Normocephalic and atraumatic.  Right Ear: External ear normal.  Left Ear: External ear normal.  Nose: Nose normal.  Mouth/Throat: Oropharynx is clear and moist.  Eyes: Pupils are equal, round, and reactive to light. Conjunctivae and EOM are normal.  Neck: Normal range of motion. Neck supple.  Cardiovascular: Normal rate, regular rhythm, normal heart sounds and intact distal pulses.    Pulmonary/Chest: Effort normal and breath sounds normal.  Abdominal: Soft. Bowel sounds are normal.  Musculoskeletal: Normal range of motion.  Neurological: He is alert and oriented to person, place, and time. He displays tremor.  Right leg weakness (chronic from polio)  Skin: Skin is warm. Capillary refill takes less than 2 seconds.  Nursing note and vitals reviewed.    ED Treatments / Results  Labs (all labs ordered are listed, but only abnormal results are displayed) Labs Reviewed  CBC - Abnormal; Notable for the following components:      Result Value   MCV 100.4 (*)    All other components within normal limits  COMPREHENSIVE METABOLIC PANEL - Abnormal; Notable for the following components:   Sodium 134 (*)    Potassium 5.4 (*)    Glucose, Bld 112 (*)    AST 44 (*)    Total Bilirubin 1.5 (*)    GFR calc non Af Amer 53 (*)    All other components within normal limits  I-STAT CHEM 8, ED - Abnormal; Notable for the following components:   Potassium 5.6 (*)    BUN 37 (*)    Glucose, Bld 109 (*)    Calcium, Ion 1.07 (*)    All other components within normal limits  PROTIME-INR  APTT  DIFFERENTIAL  URINALYSIS, ROUTINE W REFLEX MICROSCOPIC  I-STAT TROPONIN, ED  CBG MONITORING, ED    EKG None  Radiology Ct Head Code Stroke Wo Contrast  Result Date: 01/02/2018 CLINICAL DATA:  Code stroke.  Right-sided deficits. EXAM: CT HEAD WITHOUT CONTRAST TECHNIQUE: Contiguous axial images were obtained from the base of the skull through the vertex without intravenous contrast. COMPARISON:  02/04/2015 and 01/05/2015 FINDINGS: Brain: No acute large territory infarct, intracranial hemorrhage, mass, midline shift, or extra-axial fluid collection is identified. Multiple lacunar infarcts are present in the basal ganglia and thalami bilaterally. These lacune superior more conspicuous/more numerous compared to the 02/04/2015 study though do not appear as difference when compared to the  01/05/2015 study although it is difficult to exclude the presence of new lacunar infarcts, particularly in the right thalamus and right basal ganglia. Patchy to confluent cerebral white matter hypodensities have progressed and are nonspecific but compatible with moderately extensive chronic small vessel ischemic disease. There is moderate cerebral atrophy. Vascular: Calcified atherosclerosis at the skull base. Generalized intracranial arterial ectasia without suspicious focal vessel hyperdensity. Skull: No fracture or focal osseous lesion. Partially visualized severe left atlantooccipital arthropathy. Sinuses/Orbits: Visualized paranasal sinuses and mastoid air cells are clear. Bilateral cataract extraction is noted. Other: None. ASPECTS [facility] Stroke Program Early CT Score) - Ganglionic level infarction (caudate, lentiform nuclei, internal capsule, insula, M1-M3 cortex): 7 - Supraganglionic infarction (M4-M6 cortex): 3 Total score (0-10 with 10  being normal): 10 IMPRESSION: 1. No evidence of acute cortically based infarct or intracranial hemorrhage. 2. With regards to the left MCA territory, ASPECTS is 10 (assuming the left basal ganglia infarcts are all chronic). 3. Numerous lacunar infarcts in the basal ganglia and thalami bilaterally, likely mildly progressed from 2016 more so on the right. A superimposed acute lacunar is not excluded. 4. Moderately extensive chronic small vessel ischemic disease. These results were communicated to Dr. Lorraine Lax at 3:02 pm on 01/02/2018 by text page via the [facility] messaging system. Electronically Signed   By: Logan Bores M.D.   On: 01/02/2018 15:06    Procedures Procedures (including critical care time)  Medications Ordered in ED Medications  sodium chloride 0.9 % bolus 1,000 mL (1,000 mLs Intravenous New Bag/Given 01/02/18 1525)     Initial Impression / Assessment and Plan / ED Course  I have reviewed the triage vital signs and the nursing notes.  Pertinent labs &  imaging results that were available during my care of the patient were reviewed by me and considered in my medical decision making (see chart for details).  Stroke team met pt [place] the bridge.  He was met by Dr. Lorraine Lax (neurology) who does not feel like pt is a candidate for tpa as sx don't appear significantly different than usual.  Pt's daughter is here and questions were answered.  Still awaiting ua/cxr at shift change.  Pt signed out to Dr. Laverta Baltimore.  Final Clinical Impressions(s) / ED Diagnoses   Final diagnoses:  Altered mental status, unspecified altered mental status type    ED Discharge Orders    None       Isla Pence, MD 01/02/18 1542

## 2018-01-02 NOTE — Discharge Instructions (Signed)
You were seen in the ED with likely dehydration. Return to the ED with new or worsening symptoms.

## 2018-01-02 NOTE — ED Triage Notes (Signed)
Pt from [facility] facility for code stroke. LSN 1300 today. Pt was eating lunch when staff first noticed generalized weakness but more so on the right. EMS reports R sided leg weakness and R sided facial droop. Pt a.o, denies pain

## 2018-01-03 LAB — CBG MONITORING, ED: Glucose-Capillary: 127 mg/dL — ABNORMAL HIGH (ref 70–99)

## 2018-01-16 DIAGNOSIS — Z8546 Personal history of malignant neoplasm of prostate: Secondary | ICD-10-CM | POA: Diagnosis not present

## 2018-01-16 DIAGNOSIS — I679 Cerebrovascular disease, unspecified: Secondary | ICD-10-CM | POA: Diagnosis not present

## 2018-01-16 DIAGNOSIS — G3183 Dementia with Lewy bodies: Secondary | ICD-10-CM | POA: Diagnosis not present

## 2018-01-16 DIAGNOSIS — I10 Essential (primary) hypertension: Secondary | ICD-10-CM | POA: Diagnosis not present

## 2018-01-16 DIAGNOSIS — G20 Parkinson's disease: Secondary | ICD-10-CM | POA: Diagnosis not present

## 2018-01-16 DIAGNOSIS — R54 Age-related physical debility: Secondary | ICD-10-CM | POA: Diagnosis not present

## 2018-01-16 DIAGNOSIS — F339 Major depressive disorder, recurrent, unspecified: Secondary | ICD-10-CM | POA: Diagnosis not present

## 2018-01-18 DIAGNOSIS — R54 Age-related physical debility: Secondary | ICD-10-CM | POA: Diagnosis not present

## 2018-01-18 DIAGNOSIS — F339 Major depressive disorder, recurrent, unspecified: Secondary | ICD-10-CM | POA: Diagnosis not present

## 2018-01-18 DIAGNOSIS — G20 Parkinson's disease: Secondary | ICD-10-CM | POA: Diagnosis not present

## 2018-01-18 DIAGNOSIS — I679 Cerebrovascular disease, unspecified: Secondary | ICD-10-CM | POA: Diagnosis not present

## 2018-01-18 DIAGNOSIS — I10 Essential (primary) hypertension: Secondary | ICD-10-CM | POA: Diagnosis not present

## 2018-01-18 DIAGNOSIS — G3183 Dementia with Lewy bodies: Secondary | ICD-10-CM | POA: Diagnosis not present

## 2018-01-21 DIAGNOSIS — I10 Essential (primary) hypertension: Secondary | ICD-10-CM | POA: Diagnosis not present

## 2018-01-21 DIAGNOSIS — G3183 Dementia with Lewy bodies: Secondary | ICD-10-CM | POA: Diagnosis not present

## 2018-01-21 DIAGNOSIS — F339 Major depressive disorder, recurrent, unspecified: Secondary | ICD-10-CM | POA: Diagnosis not present

## 2018-01-21 DIAGNOSIS — G20 Parkinson's disease: Secondary | ICD-10-CM | POA: Diagnosis not present

## 2018-01-21 DIAGNOSIS — I679 Cerebrovascular disease, unspecified: Secondary | ICD-10-CM | POA: Diagnosis not present

## 2018-01-21 DIAGNOSIS — R54 Age-related physical debility: Secondary | ICD-10-CM | POA: Diagnosis not present

## 2018-01-25 DIAGNOSIS — I10 Essential (primary) hypertension: Secondary | ICD-10-CM | POA: Diagnosis not present

## 2018-01-25 DIAGNOSIS — G20 Parkinson's disease: Secondary | ICD-10-CM | POA: Diagnosis not present

## 2018-01-25 DIAGNOSIS — I679 Cerebrovascular disease, unspecified: Secondary | ICD-10-CM | POA: Diagnosis not present

## 2018-01-25 DIAGNOSIS — G3183 Dementia with Lewy bodies: Secondary | ICD-10-CM | POA: Diagnosis not present

## 2018-01-25 DIAGNOSIS — R54 Age-related physical debility: Secondary | ICD-10-CM | POA: Diagnosis not present

## 2018-01-25 DIAGNOSIS — F339 Major depressive disorder, recurrent, unspecified: Secondary | ICD-10-CM | POA: Diagnosis not present

## 2018-01-27 DIAGNOSIS — F339 Major depressive disorder, recurrent, unspecified: Secondary | ICD-10-CM | POA: Diagnosis not present

## 2018-01-27 DIAGNOSIS — I10 Essential (primary) hypertension: Secondary | ICD-10-CM | POA: Diagnosis not present

## 2018-01-27 DIAGNOSIS — G20 Parkinson's disease: Secondary | ICD-10-CM | POA: Diagnosis not present

## 2018-01-27 DIAGNOSIS — R54 Age-related physical debility: Secondary | ICD-10-CM | POA: Diagnosis not present

## 2018-01-27 DIAGNOSIS — G3183 Dementia with Lewy bodies: Secondary | ICD-10-CM | POA: Diagnosis not present

## 2018-01-27 DIAGNOSIS — I679 Cerebrovascular disease, unspecified: Secondary | ICD-10-CM | POA: Diagnosis not present

## 2018-01-28 DIAGNOSIS — I10 Essential (primary) hypertension: Secondary | ICD-10-CM | POA: Diagnosis not present

## 2018-01-28 DIAGNOSIS — G3183 Dementia with Lewy bodies: Secondary | ICD-10-CM | POA: Diagnosis not present

## 2018-01-28 DIAGNOSIS — G20 Parkinson's disease: Secondary | ICD-10-CM | POA: Diagnosis not present

## 2018-01-28 DIAGNOSIS — I679 Cerebrovascular disease, unspecified: Secondary | ICD-10-CM | POA: Diagnosis not present

## 2018-01-28 DIAGNOSIS — R54 Age-related physical debility: Secondary | ICD-10-CM | POA: Diagnosis not present

## 2018-01-28 DIAGNOSIS — F339 Major depressive disorder, recurrent, unspecified: Secondary | ICD-10-CM | POA: Diagnosis not present

## 2018-02-01 DIAGNOSIS — F339 Major depressive disorder, recurrent, unspecified: Secondary | ICD-10-CM | POA: Diagnosis not present

## 2018-02-01 DIAGNOSIS — I679 Cerebrovascular disease, unspecified: Secondary | ICD-10-CM | POA: Diagnosis not present

## 2018-02-01 DIAGNOSIS — R54 Age-related physical debility: Secondary | ICD-10-CM | POA: Diagnosis not present

## 2018-02-01 DIAGNOSIS — G3183 Dementia with Lewy bodies: Secondary | ICD-10-CM | POA: Diagnosis not present

## 2018-02-01 DIAGNOSIS — G20 Parkinson's disease: Secondary | ICD-10-CM | POA: Diagnosis not present

## 2018-02-01 DIAGNOSIS — I10 Essential (primary) hypertension: Secondary | ICD-10-CM | POA: Diagnosis not present

## 2018-02-02 DIAGNOSIS — I10 Essential (primary) hypertension: Secondary | ICD-10-CM | POA: Diagnosis not present

## 2018-02-02 DIAGNOSIS — G3183 Dementia with Lewy bodies: Secondary | ICD-10-CM | POA: Diagnosis not present

## 2018-02-02 DIAGNOSIS — G20 Parkinson's disease: Secondary | ICD-10-CM | POA: Diagnosis not present

## 2018-02-02 DIAGNOSIS — F339 Major depressive disorder, recurrent, unspecified: Secondary | ICD-10-CM | POA: Diagnosis not present

## 2018-02-02 DIAGNOSIS — I679 Cerebrovascular disease, unspecified: Secondary | ICD-10-CM | POA: Diagnosis not present

## 2018-02-02 DIAGNOSIS — R54 Age-related physical debility: Secondary | ICD-10-CM | POA: Diagnosis not present

## 2018-02-03 DIAGNOSIS — R54 Age-related physical debility: Secondary | ICD-10-CM | POA: Diagnosis not present

## 2018-02-03 DIAGNOSIS — F339 Major depressive disorder, recurrent, unspecified: Secondary | ICD-10-CM | POA: Diagnosis not present

## 2018-02-03 DIAGNOSIS — G20 Parkinson's disease: Secondary | ICD-10-CM | POA: Diagnosis not present

## 2018-02-03 DIAGNOSIS — I10 Essential (primary) hypertension: Secondary | ICD-10-CM | POA: Diagnosis not present

## 2018-02-03 DIAGNOSIS — G3183 Dementia with Lewy bodies: Secondary | ICD-10-CM | POA: Diagnosis not present

## 2018-02-03 DIAGNOSIS — I679 Cerebrovascular disease, unspecified: Secondary | ICD-10-CM | POA: Diagnosis not present

## 2018-02-04 DIAGNOSIS — G20 Parkinson's disease: Secondary | ICD-10-CM | POA: Diagnosis not present

## 2018-02-04 DIAGNOSIS — G3183 Dementia with Lewy bodies: Secondary | ICD-10-CM | POA: Diagnosis not present

## 2018-02-04 DIAGNOSIS — I679 Cerebrovascular disease, unspecified: Secondary | ICD-10-CM | POA: Diagnosis not present

## 2018-02-04 DIAGNOSIS — R54 Age-related physical debility: Secondary | ICD-10-CM | POA: Diagnosis not present

## 2018-02-04 DIAGNOSIS — I10 Essential (primary) hypertension: Secondary | ICD-10-CM | POA: Diagnosis not present

## 2018-02-04 DIAGNOSIS — F339 Major depressive disorder, recurrent, unspecified: Secondary | ICD-10-CM | POA: Diagnosis not present

## 2018-02-07 DIAGNOSIS — I679 Cerebrovascular disease, unspecified: Secondary | ICD-10-CM | POA: Diagnosis not present

## 2018-02-07 DIAGNOSIS — F339 Major depressive disorder, recurrent, unspecified: Secondary | ICD-10-CM | POA: Diagnosis not present

## 2018-02-07 DIAGNOSIS — R54 Age-related physical debility: Secondary | ICD-10-CM | POA: Diagnosis not present

## 2018-02-07 DIAGNOSIS — G20 Parkinson's disease: Secondary | ICD-10-CM | POA: Diagnosis not present

## 2018-02-07 DIAGNOSIS — I10 Essential (primary) hypertension: Secondary | ICD-10-CM | POA: Diagnosis not present

## 2018-02-07 DIAGNOSIS — G3183 Dementia with Lewy bodies: Secondary | ICD-10-CM | POA: Diagnosis not present

## 2018-02-08 DIAGNOSIS — I679 Cerebrovascular disease, unspecified: Secondary | ICD-10-CM | POA: Diagnosis not present

## 2018-02-08 DIAGNOSIS — F339 Major depressive disorder, recurrent, unspecified: Secondary | ICD-10-CM | POA: Diagnosis not present

## 2018-02-08 DIAGNOSIS — I10 Essential (primary) hypertension: Secondary | ICD-10-CM | POA: Diagnosis not present

## 2018-02-08 DIAGNOSIS — R54 Age-related physical debility: Secondary | ICD-10-CM | POA: Diagnosis not present

## 2018-02-08 DIAGNOSIS — G3183 Dementia with Lewy bodies: Secondary | ICD-10-CM | POA: Diagnosis not present

## 2018-02-08 DIAGNOSIS — G20 Parkinson's disease: Secondary | ICD-10-CM | POA: Diagnosis not present

## 2018-02-10 DIAGNOSIS — R54 Age-related physical debility: Secondary | ICD-10-CM | POA: Diagnosis not present

## 2018-02-10 DIAGNOSIS — G3183 Dementia with Lewy bodies: Secondary | ICD-10-CM | POA: Diagnosis not present

## 2018-02-10 DIAGNOSIS — G20 Parkinson's disease: Secondary | ICD-10-CM | POA: Diagnosis not present

## 2018-02-10 DIAGNOSIS — F339 Major depressive disorder, recurrent, unspecified: Secondary | ICD-10-CM | POA: Diagnosis not present

## 2018-02-10 DIAGNOSIS — I679 Cerebrovascular disease, unspecified: Secondary | ICD-10-CM | POA: Diagnosis not present

## 2018-02-10 DIAGNOSIS — I10 Essential (primary) hypertension: Secondary | ICD-10-CM | POA: Diagnosis not present

## 2018-02-11 DIAGNOSIS — I679 Cerebrovascular disease, unspecified: Secondary | ICD-10-CM | POA: Diagnosis not present

## 2018-02-11 DIAGNOSIS — R54 Age-related physical debility: Secondary | ICD-10-CM | POA: Diagnosis not present

## 2018-02-11 DIAGNOSIS — G20 Parkinson's disease: Secondary | ICD-10-CM | POA: Diagnosis not present

## 2018-02-11 DIAGNOSIS — G3183 Dementia with Lewy bodies: Secondary | ICD-10-CM | POA: Diagnosis not present

## 2018-02-11 DIAGNOSIS — I10 Essential (primary) hypertension: Secondary | ICD-10-CM | POA: Diagnosis not present

## 2018-02-11 DIAGNOSIS — F339 Major depressive disorder, recurrent, unspecified: Secondary | ICD-10-CM | POA: Diagnosis not present

## 2018-02-14 DIAGNOSIS — R54 Age-related physical debility: Secondary | ICD-10-CM | POA: Diagnosis not present

## 2018-02-14 DIAGNOSIS — I679 Cerebrovascular disease, unspecified: Secondary | ICD-10-CM | POA: Diagnosis not present

## 2018-02-14 DIAGNOSIS — I10 Essential (primary) hypertension: Secondary | ICD-10-CM | POA: Diagnosis not present

## 2018-02-14 DIAGNOSIS — G3183 Dementia with Lewy bodies: Secondary | ICD-10-CM | POA: Diagnosis not present

## 2018-02-14 DIAGNOSIS — F339 Major depressive disorder, recurrent, unspecified: Secondary | ICD-10-CM | POA: Diagnosis not present

## 2018-02-14 DIAGNOSIS — G20 Parkinson's disease: Secondary | ICD-10-CM | POA: Diagnosis not present

## 2018-02-15 DIAGNOSIS — F339 Major depressive disorder, recurrent, unspecified: Secondary | ICD-10-CM | POA: Diagnosis not present

## 2018-02-15 DIAGNOSIS — I679 Cerebrovascular disease, unspecified: Secondary | ICD-10-CM | POA: Diagnosis not present

## 2018-02-15 DIAGNOSIS — G20 Parkinson's disease: Secondary | ICD-10-CM | POA: Diagnosis not present

## 2018-02-15 DIAGNOSIS — I10 Essential (primary) hypertension: Secondary | ICD-10-CM | POA: Diagnosis not present

## 2018-02-15 DIAGNOSIS — R54 Age-related physical debility: Secondary | ICD-10-CM | POA: Diagnosis not present

## 2018-02-15 DIAGNOSIS — G3183 Dementia with Lewy bodies: Secondary | ICD-10-CM | POA: Diagnosis not present

## 2018-02-16 DIAGNOSIS — Z8546 Personal history of malignant neoplasm of prostate: Secondary | ICD-10-CM | POA: Diagnosis not present

## 2018-02-16 DIAGNOSIS — F339 Major depressive disorder, recurrent, unspecified: Secondary | ICD-10-CM | POA: Diagnosis not present

## 2018-02-16 DIAGNOSIS — R54 Age-related physical debility: Secondary | ICD-10-CM | POA: Diagnosis not present

## 2018-02-16 DIAGNOSIS — G20 Parkinson's disease: Secondary | ICD-10-CM | POA: Diagnosis not present

## 2018-02-16 DIAGNOSIS — G3183 Dementia with Lewy bodies: Secondary | ICD-10-CM | POA: Diagnosis not present

## 2018-02-16 DIAGNOSIS — I10 Essential (primary) hypertension: Secondary | ICD-10-CM | POA: Diagnosis not present

## 2018-02-16 DIAGNOSIS — I679 Cerebrovascular disease, unspecified: Secondary | ICD-10-CM | POA: Diagnosis not present

## 2018-02-18 DIAGNOSIS — F339 Major depressive disorder, recurrent, unspecified: Secondary | ICD-10-CM | POA: Diagnosis not present

## 2018-02-18 DIAGNOSIS — I679 Cerebrovascular disease, unspecified: Secondary | ICD-10-CM | POA: Diagnosis not present

## 2018-02-18 DIAGNOSIS — I10 Essential (primary) hypertension: Secondary | ICD-10-CM | POA: Diagnosis not present

## 2018-02-18 DIAGNOSIS — G3183 Dementia with Lewy bodies: Secondary | ICD-10-CM | POA: Diagnosis not present

## 2018-02-18 DIAGNOSIS — G20 Parkinson's disease: Secondary | ICD-10-CM | POA: Diagnosis not present

## 2018-02-18 DIAGNOSIS — R54 Age-related physical debility: Secondary | ICD-10-CM | POA: Diagnosis not present

## 2018-02-21 DIAGNOSIS — F339 Major depressive disorder, recurrent, unspecified: Secondary | ICD-10-CM | POA: Diagnosis not present

## 2018-02-21 DIAGNOSIS — I679 Cerebrovascular disease, unspecified: Secondary | ICD-10-CM | POA: Diagnosis not present

## 2018-02-21 DIAGNOSIS — G20 Parkinson's disease: Secondary | ICD-10-CM | POA: Diagnosis not present

## 2018-02-21 DIAGNOSIS — I10 Essential (primary) hypertension: Secondary | ICD-10-CM | POA: Diagnosis not present

## 2018-02-21 DIAGNOSIS — G3183 Dementia with Lewy bodies: Secondary | ICD-10-CM | POA: Diagnosis not present

## 2018-02-21 DIAGNOSIS — R54 Age-related physical debility: Secondary | ICD-10-CM | POA: Diagnosis not present

## 2018-02-22 DIAGNOSIS — G3183 Dementia with Lewy bodies: Secondary | ICD-10-CM | POA: Diagnosis not present

## 2018-02-22 DIAGNOSIS — F339 Major depressive disorder, recurrent, unspecified: Secondary | ICD-10-CM | POA: Diagnosis not present

## 2018-02-22 DIAGNOSIS — I10 Essential (primary) hypertension: Secondary | ICD-10-CM | POA: Diagnosis not present

## 2018-02-22 DIAGNOSIS — G20 Parkinson's disease: Secondary | ICD-10-CM | POA: Diagnosis not present

## 2018-02-22 DIAGNOSIS — I679 Cerebrovascular disease, unspecified: Secondary | ICD-10-CM | POA: Diagnosis not present

## 2018-02-22 DIAGNOSIS — R54 Age-related physical debility: Secondary | ICD-10-CM | POA: Diagnosis not present

## 2018-02-25 DIAGNOSIS — F339 Major depressive disorder, recurrent, unspecified: Secondary | ICD-10-CM | POA: Diagnosis not present

## 2018-02-25 DIAGNOSIS — G3183 Dementia with Lewy bodies: Secondary | ICD-10-CM | POA: Diagnosis not present

## 2018-02-25 DIAGNOSIS — I10 Essential (primary) hypertension: Secondary | ICD-10-CM | POA: Diagnosis not present

## 2018-02-25 DIAGNOSIS — G20 Parkinson's disease: Secondary | ICD-10-CM | POA: Diagnosis not present

## 2018-02-25 DIAGNOSIS — I679 Cerebrovascular disease, unspecified: Secondary | ICD-10-CM | POA: Diagnosis not present

## 2018-02-25 DIAGNOSIS — R54 Age-related physical debility: Secondary | ICD-10-CM | POA: Diagnosis not present

## 2018-03-01 DIAGNOSIS — G20 Parkinson's disease: Secondary | ICD-10-CM | POA: Diagnosis not present

## 2018-03-01 DIAGNOSIS — G3183 Dementia with Lewy bodies: Secondary | ICD-10-CM | POA: Diagnosis not present

## 2018-03-01 DIAGNOSIS — F339 Major depressive disorder, recurrent, unspecified: Secondary | ICD-10-CM | POA: Diagnosis not present

## 2018-03-01 DIAGNOSIS — R54 Age-related physical debility: Secondary | ICD-10-CM | POA: Diagnosis not present

## 2018-03-01 DIAGNOSIS — I679 Cerebrovascular disease, unspecified: Secondary | ICD-10-CM | POA: Diagnosis not present

## 2018-03-01 DIAGNOSIS — I10 Essential (primary) hypertension: Secondary | ICD-10-CM | POA: Diagnosis not present

## 2018-03-04 DIAGNOSIS — I679 Cerebrovascular disease, unspecified: Secondary | ICD-10-CM | POA: Diagnosis not present

## 2018-03-04 DIAGNOSIS — G3183 Dementia with Lewy bodies: Secondary | ICD-10-CM | POA: Diagnosis not present

## 2018-03-04 DIAGNOSIS — R54 Age-related physical debility: Secondary | ICD-10-CM | POA: Diagnosis not present

## 2018-03-04 DIAGNOSIS — I10 Essential (primary) hypertension: Secondary | ICD-10-CM | POA: Diagnosis not present

## 2018-03-04 DIAGNOSIS — F339 Major depressive disorder, recurrent, unspecified: Secondary | ICD-10-CM | POA: Diagnosis not present

## 2018-03-04 DIAGNOSIS — G20 Parkinson's disease: Secondary | ICD-10-CM | POA: Diagnosis not present

## 2018-03-08 DIAGNOSIS — G20 Parkinson's disease: Secondary | ICD-10-CM | POA: Diagnosis not present

## 2018-03-08 DIAGNOSIS — I10 Essential (primary) hypertension: Secondary | ICD-10-CM | POA: Diagnosis not present

## 2018-03-08 DIAGNOSIS — R54 Age-related physical debility: Secondary | ICD-10-CM | POA: Diagnosis not present

## 2018-03-08 DIAGNOSIS — G3183 Dementia with Lewy bodies: Secondary | ICD-10-CM | POA: Diagnosis not present

## 2018-03-08 DIAGNOSIS — F339 Major depressive disorder, recurrent, unspecified: Secondary | ICD-10-CM | POA: Diagnosis not present

## 2018-03-08 DIAGNOSIS — I679 Cerebrovascular disease, unspecified: Secondary | ICD-10-CM | POA: Diagnosis not present

## 2018-03-09 DIAGNOSIS — F339 Major depressive disorder, recurrent, unspecified: Secondary | ICD-10-CM | POA: Diagnosis not present

## 2018-03-09 DIAGNOSIS — R54 Age-related physical debility: Secondary | ICD-10-CM | POA: Diagnosis not present

## 2018-03-09 DIAGNOSIS — G3183 Dementia with Lewy bodies: Secondary | ICD-10-CM | POA: Diagnosis not present

## 2018-03-09 DIAGNOSIS — G20 Parkinson's disease: Secondary | ICD-10-CM | POA: Diagnosis not present

## 2018-03-09 DIAGNOSIS — I10 Essential (primary) hypertension: Secondary | ICD-10-CM | POA: Diagnosis not present

## 2018-03-09 DIAGNOSIS — I679 Cerebrovascular disease, unspecified: Secondary | ICD-10-CM | POA: Diagnosis not present

## 2018-03-10 DIAGNOSIS — I10 Essential (primary) hypertension: Secondary | ICD-10-CM | POA: Diagnosis not present

## 2018-03-10 DIAGNOSIS — I679 Cerebrovascular disease, unspecified: Secondary | ICD-10-CM | POA: Diagnosis not present

## 2018-03-10 DIAGNOSIS — F339 Major depressive disorder, recurrent, unspecified: Secondary | ICD-10-CM | POA: Diagnosis not present

## 2018-03-10 DIAGNOSIS — G20 Parkinson's disease: Secondary | ICD-10-CM | POA: Diagnosis not present

## 2018-03-10 DIAGNOSIS — R54 Age-related physical debility: Secondary | ICD-10-CM | POA: Diagnosis not present

## 2018-03-10 DIAGNOSIS — G3183 Dementia with Lewy bodies: Secondary | ICD-10-CM | POA: Diagnosis not present

## 2018-03-11 DIAGNOSIS — G20 Parkinson's disease: Secondary | ICD-10-CM | POA: Diagnosis not present

## 2018-03-11 DIAGNOSIS — I679 Cerebrovascular disease, unspecified: Secondary | ICD-10-CM | POA: Diagnosis not present

## 2018-03-11 DIAGNOSIS — I10 Essential (primary) hypertension: Secondary | ICD-10-CM | POA: Diagnosis not present

## 2018-03-11 DIAGNOSIS — R54 Age-related physical debility: Secondary | ICD-10-CM | POA: Diagnosis not present

## 2018-03-11 DIAGNOSIS — F339 Major depressive disorder, recurrent, unspecified: Secondary | ICD-10-CM | POA: Diagnosis not present

## 2018-03-11 DIAGNOSIS — G3183 Dementia with Lewy bodies: Secondary | ICD-10-CM | POA: Diagnosis not present

## 2018-03-15 DIAGNOSIS — G20 Parkinson's disease: Secondary | ICD-10-CM | POA: Diagnosis not present

## 2018-03-15 DIAGNOSIS — I10 Essential (primary) hypertension: Secondary | ICD-10-CM | POA: Diagnosis not present

## 2018-03-15 DIAGNOSIS — G3183 Dementia with Lewy bodies: Secondary | ICD-10-CM | POA: Diagnosis not present

## 2018-03-15 DIAGNOSIS — I679 Cerebrovascular disease, unspecified: Secondary | ICD-10-CM | POA: Diagnosis not present

## 2018-03-15 DIAGNOSIS — R54 Age-related physical debility: Secondary | ICD-10-CM | POA: Diagnosis not present

## 2018-03-15 DIAGNOSIS — F339 Major depressive disorder, recurrent, unspecified: Secondary | ICD-10-CM | POA: Diagnosis not present

## 2018-03-17 DIAGNOSIS — F339 Major depressive disorder, recurrent, unspecified: Secondary | ICD-10-CM | POA: Diagnosis not present

## 2018-03-17 DIAGNOSIS — R54 Age-related physical debility: Secondary | ICD-10-CM | POA: Diagnosis not present

## 2018-03-17 DIAGNOSIS — G3183 Dementia with Lewy bodies: Secondary | ICD-10-CM | POA: Diagnosis not present

## 2018-03-17 DIAGNOSIS — I10 Essential (primary) hypertension: Secondary | ICD-10-CM | POA: Diagnosis not present

## 2018-03-17 DIAGNOSIS — I679 Cerebrovascular disease, unspecified: Secondary | ICD-10-CM | POA: Diagnosis not present

## 2018-03-17 DIAGNOSIS — G20 Parkinson's disease: Secondary | ICD-10-CM | POA: Diagnosis not present

## 2018-03-18 DIAGNOSIS — I10 Essential (primary) hypertension: Secondary | ICD-10-CM | POA: Diagnosis not present

## 2018-03-18 DIAGNOSIS — R54 Age-related physical debility: Secondary | ICD-10-CM | POA: Diagnosis not present

## 2018-03-18 DIAGNOSIS — I679 Cerebrovascular disease, unspecified: Secondary | ICD-10-CM | POA: Diagnosis not present

## 2018-03-18 DIAGNOSIS — G20 Parkinson's disease: Secondary | ICD-10-CM | POA: Diagnosis not present

## 2018-03-18 DIAGNOSIS — G3183 Dementia with Lewy bodies: Secondary | ICD-10-CM | POA: Diagnosis not present

## 2018-03-18 DIAGNOSIS — F339 Major depressive disorder, recurrent, unspecified: Secondary | ICD-10-CM | POA: Diagnosis not present

## 2018-03-19 DIAGNOSIS — G20 Parkinson's disease: Secondary | ICD-10-CM | POA: Diagnosis not present

## 2018-03-19 DIAGNOSIS — R54 Age-related physical debility: Secondary | ICD-10-CM | POA: Diagnosis not present

## 2018-03-19 DIAGNOSIS — F339 Major depressive disorder, recurrent, unspecified: Secondary | ICD-10-CM | POA: Diagnosis not present

## 2018-03-19 DIAGNOSIS — I10 Essential (primary) hypertension: Secondary | ICD-10-CM | POA: Diagnosis not present

## 2018-03-19 DIAGNOSIS — Z8546 Personal history of malignant neoplasm of prostate: Secondary | ICD-10-CM | POA: Diagnosis not present

## 2018-03-19 DIAGNOSIS — G3183 Dementia with Lewy bodies: Secondary | ICD-10-CM | POA: Diagnosis not present

## 2018-03-19 DIAGNOSIS — I679 Cerebrovascular disease, unspecified: Secondary | ICD-10-CM | POA: Diagnosis not present

## 2018-03-21 DIAGNOSIS — R54 Age-related physical debility: Secondary | ICD-10-CM | POA: Diagnosis not present

## 2018-03-21 DIAGNOSIS — F339 Major depressive disorder, recurrent, unspecified: Secondary | ICD-10-CM | POA: Diagnosis not present

## 2018-03-21 DIAGNOSIS — I679 Cerebrovascular disease, unspecified: Secondary | ICD-10-CM | POA: Diagnosis not present

## 2018-03-21 DIAGNOSIS — G3183 Dementia with Lewy bodies: Secondary | ICD-10-CM | POA: Diagnosis not present

## 2018-03-21 DIAGNOSIS — I10 Essential (primary) hypertension: Secondary | ICD-10-CM | POA: Diagnosis not present

## 2018-03-21 DIAGNOSIS — G20 Parkinson's disease: Secondary | ICD-10-CM | POA: Diagnosis not present

## 2018-03-22 DIAGNOSIS — G3183 Dementia with Lewy bodies: Secondary | ICD-10-CM | POA: Diagnosis not present

## 2018-03-22 DIAGNOSIS — G20 Parkinson's disease: Secondary | ICD-10-CM | POA: Diagnosis not present

## 2018-03-22 DIAGNOSIS — R54 Age-related physical debility: Secondary | ICD-10-CM | POA: Diagnosis not present

## 2018-03-22 DIAGNOSIS — I10 Essential (primary) hypertension: Secondary | ICD-10-CM | POA: Diagnosis not present

## 2018-03-22 DIAGNOSIS — I679 Cerebrovascular disease, unspecified: Secondary | ICD-10-CM | POA: Diagnosis not present

## 2018-03-22 DIAGNOSIS — F339 Major depressive disorder, recurrent, unspecified: Secondary | ICD-10-CM | POA: Diagnosis not present

## 2018-03-23 DIAGNOSIS — I679 Cerebrovascular disease, unspecified: Secondary | ICD-10-CM | POA: Diagnosis not present

## 2018-03-23 DIAGNOSIS — G3183 Dementia with Lewy bodies: Secondary | ICD-10-CM | POA: Diagnosis not present

## 2018-03-23 DIAGNOSIS — R54 Age-related physical debility: Secondary | ICD-10-CM | POA: Diagnosis not present

## 2018-03-23 DIAGNOSIS — G20 Parkinson's disease: Secondary | ICD-10-CM | POA: Diagnosis not present

## 2018-03-23 DIAGNOSIS — F339 Major depressive disorder, recurrent, unspecified: Secondary | ICD-10-CM | POA: Diagnosis not present

## 2018-03-23 DIAGNOSIS — I10 Essential (primary) hypertension: Secondary | ICD-10-CM | POA: Diagnosis not present

## 2018-03-24 DIAGNOSIS — I679 Cerebrovascular disease, unspecified: Secondary | ICD-10-CM | POA: Diagnosis not present

## 2018-03-24 DIAGNOSIS — F339 Major depressive disorder, recurrent, unspecified: Secondary | ICD-10-CM | POA: Diagnosis not present

## 2018-03-24 DIAGNOSIS — I10 Essential (primary) hypertension: Secondary | ICD-10-CM | POA: Diagnosis not present

## 2018-03-24 DIAGNOSIS — G20 Parkinson's disease: Secondary | ICD-10-CM | POA: Diagnosis not present

## 2018-03-24 DIAGNOSIS — R54 Age-related physical debility: Secondary | ICD-10-CM | POA: Diagnosis not present

## 2018-03-24 DIAGNOSIS — G3183 Dementia with Lewy bodies: Secondary | ICD-10-CM | POA: Diagnosis not present

## 2018-03-27 DIAGNOSIS — G20 Parkinson's disease: Secondary | ICD-10-CM | POA: Diagnosis not present

## 2018-03-27 DIAGNOSIS — I679 Cerebrovascular disease, unspecified: Secondary | ICD-10-CM | POA: Diagnosis not present

## 2018-03-27 DIAGNOSIS — G3183 Dementia with Lewy bodies: Secondary | ICD-10-CM | POA: Diagnosis not present

## 2018-03-27 DIAGNOSIS — I10 Essential (primary) hypertension: Secondary | ICD-10-CM | POA: Diagnosis not present

## 2018-03-27 DIAGNOSIS — R54 Age-related physical debility: Secondary | ICD-10-CM | POA: Diagnosis not present

## 2018-03-27 DIAGNOSIS — F339 Major depressive disorder, recurrent, unspecified: Secondary | ICD-10-CM | POA: Diagnosis not present

## 2018-03-29 DIAGNOSIS — F339 Major depressive disorder, recurrent, unspecified: Secondary | ICD-10-CM | POA: Diagnosis not present

## 2018-03-29 DIAGNOSIS — G3183 Dementia with Lewy bodies: Secondary | ICD-10-CM | POA: Diagnosis not present

## 2018-03-29 DIAGNOSIS — R54 Age-related physical debility: Secondary | ICD-10-CM | POA: Diagnosis not present

## 2018-03-29 DIAGNOSIS — G20 Parkinson's disease: Secondary | ICD-10-CM | POA: Diagnosis not present

## 2018-03-29 DIAGNOSIS — I10 Essential (primary) hypertension: Secondary | ICD-10-CM | POA: Diagnosis not present

## 2018-03-29 DIAGNOSIS — I679 Cerebrovascular disease, unspecified: Secondary | ICD-10-CM | POA: Diagnosis not present

## 2018-03-31 DIAGNOSIS — G3183 Dementia with Lewy bodies: Secondary | ICD-10-CM | POA: Diagnosis not present

## 2018-03-31 DIAGNOSIS — I10 Essential (primary) hypertension: Secondary | ICD-10-CM | POA: Diagnosis not present

## 2018-03-31 DIAGNOSIS — I679 Cerebrovascular disease, unspecified: Secondary | ICD-10-CM | POA: Diagnosis not present

## 2018-03-31 DIAGNOSIS — G20 Parkinson's disease: Secondary | ICD-10-CM | POA: Diagnosis not present

## 2018-03-31 DIAGNOSIS — R54 Age-related physical debility: Secondary | ICD-10-CM | POA: Diagnosis not present

## 2018-03-31 DIAGNOSIS — F339 Major depressive disorder, recurrent, unspecified: Secondary | ICD-10-CM | POA: Diagnosis not present

## 2018-04-01 DIAGNOSIS — R54 Age-related physical debility: Secondary | ICD-10-CM | POA: Diagnosis not present

## 2018-04-01 DIAGNOSIS — G3183 Dementia with Lewy bodies: Secondary | ICD-10-CM | POA: Diagnosis not present

## 2018-04-01 DIAGNOSIS — I679 Cerebrovascular disease, unspecified: Secondary | ICD-10-CM | POA: Diagnosis not present

## 2018-04-01 DIAGNOSIS — I10 Essential (primary) hypertension: Secondary | ICD-10-CM | POA: Diagnosis not present

## 2018-04-01 DIAGNOSIS — F339 Major depressive disorder, recurrent, unspecified: Secondary | ICD-10-CM | POA: Diagnosis not present

## 2018-04-01 DIAGNOSIS — G20 Parkinson's disease: Secondary | ICD-10-CM | POA: Diagnosis not present

## 2018-04-04 DIAGNOSIS — R54 Age-related physical debility: Secondary | ICD-10-CM | POA: Diagnosis not present

## 2018-04-04 DIAGNOSIS — G3183 Dementia with Lewy bodies: Secondary | ICD-10-CM | POA: Diagnosis not present

## 2018-04-04 DIAGNOSIS — F339 Major depressive disorder, recurrent, unspecified: Secondary | ICD-10-CM | POA: Diagnosis not present

## 2018-04-04 DIAGNOSIS — G20 Parkinson's disease: Secondary | ICD-10-CM | POA: Diagnosis not present

## 2018-04-04 DIAGNOSIS — I679 Cerebrovascular disease, unspecified: Secondary | ICD-10-CM | POA: Diagnosis not present

## 2018-04-04 DIAGNOSIS — I10 Essential (primary) hypertension: Secondary | ICD-10-CM | POA: Diagnosis not present

## 2018-04-05 DIAGNOSIS — I679 Cerebrovascular disease, unspecified: Secondary | ICD-10-CM | POA: Diagnosis not present

## 2018-04-05 DIAGNOSIS — R54 Age-related physical debility: Secondary | ICD-10-CM | POA: Diagnosis not present

## 2018-04-05 DIAGNOSIS — F339 Major depressive disorder, recurrent, unspecified: Secondary | ICD-10-CM | POA: Diagnosis not present

## 2018-04-05 DIAGNOSIS — I10 Essential (primary) hypertension: Secondary | ICD-10-CM | POA: Diagnosis not present

## 2018-04-05 DIAGNOSIS — G3183 Dementia with Lewy bodies: Secondary | ICD-10-CM | POA: Diagnosis not present

## 2018-04-05 DIAGNOSIS — G20 Parkinson's disease: Secondary | ICD-10-CM | POA: Diagnosis not present

## 2018-04-08 DIAGNOSIS — F339 Major depressive disorder, recurrent, unspecified: Secondary | ICD-10-CM | POA: Diagnosis not present

## 2018-04-08 DIAGNOSIS — I679 Cerebrovascular disease, unspecified: Secondary | ICD-10-CM | POA: Diagnosis not present

## 2018-04-08 DIAGNOSIS — G20 Parkinson's disease: Secondary | ICD-10-CM | POA: Diagnosis not present

## 2018-04-08 DIAGNOSIS — I10 Essential (primary) hypertension: Secondary | ICD-10-CM | POA: Diagnosis not present

## 2018-04-08 DIAGNOSIS — R54 Age-related physical debility: Secondary | ICD-10-CM | POA: Diagnosis not present

## 2018-04-08 DIAGNOSIS — G3183 Dementia with Lewy bodies: Secondary | ICD-10-CM | POA: Diagnosis not present

## 2018-04-12 DIAGNOSIS — G3183 Dementia with Lewy bodies: Secondary | ICD-10-CM | POA: Diagnosis not present

## 2018-04-12 DIAGNOSIS — I679 Cerebrovascular disease, unspecified: Secondary | ICD-10-CM | POA: Diagnosis not present

## 2018-04-12 DIAGNOSIS — I10 Essential (primary) hypertension: Secondary | ICD-10-CM | POA: Diagnosis not present

## 2018-04-12 DIAGNOSIS — R54 Age-related physical debility: Secondary | ICD-10-CM | POA: Diagnosis not present

## 2018-04-12 DIAGNOSIS — F339 Major depressive disorder, recurrent, unspecified: Secondary | ICD-10-CM | POA: Diagnosis not present

## 2018-04-12 DIAGNOSIS — G20 Parkinson's disease: Secondary | ICD-10-CM | POA: Diagnosis not present

## 2018-04-14 DIAGNOSIS — I10 Essential (primary) hypertension: Secondary | ICD-10-CM | POA: Diagnosis not present

## 2018-04-14 DIAGNOSIS — F339 Major depressive disorder, recurrent, unspecified: Secondary | ICD-10-CM | POA: Diagnosis not present

## 2018-04-14 DIAGNOSIS — G20 Parkinson's disease: Secondary | ICD-10-CM | POA: Diagnosis not present

## 2018-04-14 DIAGNOSIS — I679 Cerebrovascular disease, unspecified: Secondary | ICD-10-CM | POA: Diagnosis not present

## 2018-04-14 DIAGNOSIS — G3183 Dementia with Lewy bodies: Secondary | ICD-10-CM | POA: Diagnosis not present

## 2018-04-14 DIAGNOSIS — R54 Age-related physical debility: Secondary | ICD-10-CM | POA: Diagnosis not present

## 2018-04-15 DIAGNOSIS — G3183 Dementia with Lewy bodies: Secondary | ICD-10-CM | POA: Diagnosis not present

## 2018-04-15 DIAGNOSIS — I10 Essential (primary) hypertension: Secondary | ICD-10-CM | POA: Diagnosis not present

## 2018-04-15 DIAGNOSIS — R54 Age-related physical debility: Secondary | ICD-10-CM | POA: Diagnosis not present

## 2018-04-15 DIAGNOSIS — G20 Parkinson's disease: Secondary | ICD-10-CM | POA: Diagnosis not present

## 2018-04-15 DIAGNOSIS — F339 Major depressive disorder, recurrent, unspecified: Secondary | ICD-10-CM | POA: Diagnosis not present

## 2018-04-15 DIAGNOSIS — I679 Cerebrovascular disease, unspecified: Secondary | ICD-10-CM | POA: Diagnosis not present

## 2018-04-17 DIAGNOSIS — I679 Cerebrovascular disease, unspecified: Secondary | ICD-10-CM | POA: Diagnosis not present

## 2018-04-17 DIAGNOSIS — F339 Major depressive disorder, recurrent, unspecified: Secondary | ICD-10-CM | POA: Diagnosis not present

## 2018-04-17 DIAGNOSIS — G3183 Dementia with Lewy bodies: Secondary | ICD-10-CM | POA: Diagnosis not present

## 2018-04-17 DIAGNOSIS — I10 Essential (primary) hypertension: Secondary | ICD-10-CM | POA: Diagnosis not present

## 2018-04-17 DIAGNOSIS — G20 Parkinson's disease: Secondary | ICD-10-CM | POA: Diagnosis not present

## 2018-04-17 DIAGNOSIS — Z8546 Personal history of malignant neoplasm of prostate: Secondary | ICD-10-CM | POA: Diagnosis not present

## 2018-04-17 DIAGNOSIS — R54 Age-related physical debility: Secondary | ICD-10-CM | POA: Diagnosis not present

## 2018-04-18 DIAGNOSIS — G20 Parkinson's disease: Secondary | ICD-10-CM | POA: Diagnosis not present

## 2018-04-18 DIAGNOSIS — R54 Age-related physical debility: Secondary | ICD-10-CM | POA: Diagnosis not present

## 2018-04-18 DIAGNOSIS — G3183 Dementia with Lewy bodies: Secondary | ICD-10-CM | POA: Diagnosis not present

## 2018-04-18 DIAGNOSIS — F339 Major depressive disorder, recurrent, unspecified: Secondary | ICD-10-CM | POA: Diagnosis not present

## 2018-04-18 DIAGNOSIS — I679 Cerebrovascular disease, unspecified: Secondary | ICD-10-CM | POA: Diagnosis not present

## 2018-04-18 DIAGNOSIS — I10 Essential (primary) hypertension: Secondary | ICD-10-CM | POA: Diagnosis not present

## 2018-04-19 DIAGNOSIS — F339 Major depressive disorder, recurrent, unspecified: Secondary | ICD-10-CM | POA: Diagnosis not present

## 2018-04-19 DIAGNOSIS — G3183 Dementia with Lewy bodies: Secondary | ICD-10-CM | POA: Diagnosis not present

## 2018-04-19 DIAGNOSIS — G20 Parkinson's disease: Secondary | ICD-10-CM | POA: Diagnosis not present

## 2018-04-19 DIAGNOSIS — I10 Essential (primary) hypertension: Secondary | ICD-10-CM | POA: Diagnosis not present

## 2018-04-19 DIAGNOSIS — I679 Cerebrovascular disease, unspecified: Secondary | ICD-10-CM | POA: Diagnosis not present

## 2018-04-19 DIAGNOSIS — R54 Age-related physical debility: Secondary | ICD-10-CM | POA: Diagnosis not present

## 2018-04-22 DIAGNOSIS — I10 Essential (primary) hypertension: Secondary | ICD-10-CM | POA: Diagnosis not present

## 2018-04-22 DIAGNOSIS — R54 Age-related physical debility: Secondary | ICD-10-CM | POA: Diagnosis not present

## 2018-04-22 DIAGNOSIS — G3183 Dementia with Lewy bodies: Secondary | ICD-10-CM | POA: Diagnosis not present

## 2018-04-22 DIAGNOSIS — I679 Cerebrovascular disease, unspecified: Secondary | ICD-10-CM | POA: Diagnosis not present

## 2018-04-22 DIAGNOSIS — F339 Major depressive disorder, recurrent, unspecified: Secondary | ICD-10-CM | POA: Diagnosis not present

## 2018-04-22 DIAGNOSIS — G20 Parkinson's disease: Secondary | ICD-10-CM | POA: Diagnosis not present

## 2018-04-26 DIAGNOSIS — I10 Essential (primary) hypertension: Secondary | ICD-10-CM | POA: Diagnosis not present

## 2018-04-26 DIAGNOSIS — R54 Age-related physical debility: Secondary | ICD-10-CM | POA: Diagnosis not present

## 2018-04-26 DIAGNOSIS — I679 Cerebrovascular disease, unspecified: Secondary | ICD-10-CM | POA: Diagnosis not present

## 2018-04-26 DIAGNOSIS — G3183 Dementia with Lewy bodies: Secondary | ICD-10-CM | POA: Diagnosis not present

## 2018-04-26 DIAGNOSIS — F339 Major depressive disorder, recurrent, unspecified: Secondary | ICD-10-CM | POA: Diagnosis not present

## 2018-04-26 DIAGNOSIS — G20 Parkinson's disease: Secondary | ICD-10-CM | POA: Diagnosis not present

## 2018-04-27 DIAGNOSIS — I679 Cerebrovascular disease, unspecified: Secondary | ICD-10-CM | POA: Diagnosis not present

## 2018-04-27 DIAGNOSIS — F339 Major depressive disorder, recurrent, unspecified: Secondary | ICD-10-CM | POA: Diagnosis not present

## 2018-04-27 DIAGNOSIS — G3183 Dementia with Lewy bodies: Secondary | ICD-10-CM | POA: Diagnosis not present

## 2018-04-27 DIAGNOSIS — G20 Parkinson's disease: Secondary | ICD-10-CM | POA: Diagnosis not present

## 2018-04-27 DIAGNOSIS — I10 Essential (primary) hypertension: Secondary | ICD-10-CM | POA: Diagnosis not present

## 2018-04-27 DIAGNOSIS — R54 Age-related physical debility: Secondary | ICD-10-CM | POA: Diagnosis not present

## 2018-04-28 DIAGNOSIS — G3183 Dementia with Lewy bodies: Secondary | ICD-10-CM | POA: Diagnosis not present

## 2018-04-28 DIAGNOSIS — F339 Major depressive disorder, recurrent, unspecified: Secondary | ICD-10-CM | POA: Diagnosis not present

## 2018-04-28 DIAGNOSIS — R54 Age-related physical debility: Secondary | ICD-10-CM | POA: Diagnosis not present

## 2018-04-28 DIAGNOSIS — I10 Essential (primary) hypertension: Secondary | ICD-10-CM | POA: Diagnosis not present

## 2018-04-28 DIAGNOSIS — I679 Cerebrovascular disease, unspecified: Secondary | ICD-10-CM | POA: Diagnosis not present

## 2018-04-28 DIAGNOSIS — G20 Parkinson's disease: Secondary | ICD-10-CM | POA: Diagnosis not present

## 2018-04-29 DIAGNOSIS — G20 Parkinson's disease: Secondary | ICD-10-CM | POA: Diagnosis not present

## 2018-04-29 DIAGNOSIS — R54 Age-related physical debility: Secondary | ICD-10-CM | POA: Diagnosis not present

## 2018-04-29 DIAGNOSIS — I10 Essential (primary) hypertension: Secondary | ICD-10-CM | POA: Diagnosis not present

## 2018-04-29 DIAGNOSIS — G3183 Dementia with Lewy bodies: Secondary | ICD-10-CM | POA: Diagnosis not present

## 2018-04-29 DIAGNOSIS — I679 Cerebrovascular disease, unspecified: Secondary | ICD-10-CM | POA: Diagnosis not present

## 2018-04-29 DIAGNOSIS — F339 Major depressive disorder, recurrent, unspecified: Secondary | ICD-10-CM | POA: Diagnosis not present

## 2018-05-03 DIAGNOSIS — R54 Age-related physical debility: Secondary | ICD-10-CM | POA: Diagnosis not present

## 2018-05-03 DIAGNOSIS — G20 Parkinson's disease: Secondary | ICD-10-CM | POA: Diagnosis not present

## 2018-05-03 DIAGNOSIS — I10 Essential (primary) hypertension: Secondary | ICD-10-CM | POA: Diagnosis not present

## 2018-05-03 DIAGNOSIS — I679 Cerebrovascular disease, unspecified: Secondary | ICD-10-CM | POA: Diagnosis not present

## 2018-05-03 DIAGNOSIS — G3183 Dementia with Lewy bodies: Secondary | ICD-10-CM | POA: Diagnosis not present

## 2018-05-03 DIAGNOSIS — F339 Major depressive disorder, recurrent, unspecified: Secondary | ICD-10-CM | POA: Diagnosis not present

## 2018-05-04 DIAGNOSIS — I10 Essential (primary) hypertension: Secondary | ICD-10-CM | POA: Diagnosis not present

## 2018-05-04 DIAGNOSIS — G20 Parkinson's disease: Secondary | ICD-10-CM | POA: Diagnosis not present

## 2018-05-04 DIAGNOSIS — R54 Age-related physical debility: Secondary | ICD-10-CM | POA: Diagnosis not present

## 2018-05-04 DIAGNOSIS — I679 Cerebrovascular disease, unspecified: Secondary | ICD-10-CM | POA: Diagnosis not present

## 2018-05-04 DIAGNOSIS — G3183 Dementia with Lewy bodies: Secondary | ICD-10-CM | POA: Diagnosis not present

## 2018-05-04 DIAGNOSIS — F339 Major depressive disorder, recurrent, unspecified: Secondary | ICD-10-CM | POA: Diagnosis not present

## 2018-05-06 DIAGNOSIS — G3183 Dementia with Lewy bodies: Secondary | ICD-10-CM | POA: Diagnosis not present

## 2018-05-06 DIAGNOSIS — I10 Essential (primary) hypertension: Secondary | ICD-10-CM | POA: Diagnosis not present

## 2018-05-06 DIAGNOSIS — F339 Major depressive disorder, recurrent, unspecified: Secondary | ICD-10-CM | POA: Diagnosis not present

## 2018-05-06 DIAGNOSIS — I679 Cerebrovascular disease, unspecified: Secondary | ICD-10-CM | POA: Diagnosis not present

## 2018-05-06 DIAGNOSIS — G20 Parkinson's disease: Secondary | ICD-10-CM | POA: Diagnosis not present

## 2018-05-06 DIAGNOSIS — R54 Age-related physical debility: Secondary | ICD-10-CM | POA: Diagnosis not present

## 2018-05-09 DIAGNOSIS — F339 Major depressive disorder, recurrent, unspecified: Secondary | ICD-10-CM | POA: Diagnosis not present

## 2018-05-09 DIAGNOSIS — R54 Age-related physical debility: Secondary | ICD-10-CM | POA: Diagnosis not present

## 2018-05-09 DIAGNOSIS — I10 Essential (primary) hypertension: Secondary | ICD-10-CM | POA: Diagnosis not present

## 2018-05-09 DIAGNOSIS — G3183 Dementia with Lewy bodies: Secondary | ICD-10-CM | POA: Diagnosis not present

## 2018-05-09 DIAGNOSIS — G20 Parkinson's disease: Secondary | ICD-10-CM | POA: Diagnosis not present

## 2018-05-09 DIAGNOSIS — I679 Cerebrovascular disease, unspecified: Secondary | ICD-10-CM | POA: Diagnosis not present

## 2018-05-10 DIAGNOSIS — R54 Age-related physical debility: Secondary | ICD-10-CM | POA: Diagnosis not present

## 2018-05-10 DIAGNOSIS — I10 Essential (primary) hypertension: Secondary | ICD-10-CM | POA: Diagnosis not present

## 2018-05-10 DIAGNOSIS — G20 Parkinson's disease: Secondary | ICD-10-CM | POA: Diagnosis not present

## 2018-05-10 DIAGNOSIS — I679 Cerebrovascular disease, unspecified: Secondary | ICD-10-CM | POA: Diagnosis not present

## 2018-05-10 DIAGNOSIS — G3183 Dementia with Lewy bodies: Secondary | ICD-10-CM | POA: Diagnosis not present

## 2018-05-10 DIAGNOSIS — F339 Major depressive disorder, recurrent, unspecified: Secondary | ICD-10-CM | POA: Diagnosis not present

## 2018-05-13 DIAGNOSIS — R54 Age-related physical debility: Secondary | ICD-10-CM | POA: Diagnosis not present

## 2018-05-13 DIAGNOSIS — G3183 Dementia with Lewy bodies: Secondary | ICD-10-CM | POA: Diagnosis not present

## 2018-05-13 DIAGNOSIS — I679 Cerebrovascular disease, unspecified: Secondary | ICD-10-CM | POA: Diagnosis not present

## 2018-05-13 DIAGNOSIS — I10 Essential (primary) hypertension: Secondary | ICD-10-CM | POA: Diagnosis not present

## 2018-05-13 DIAGNOSIS — G20 Parkinson's disease: Secondary | ICD-10-CM | POA: Diagnosis not present

## 2018-05-13 DIAGNOSIS — F339 Major depressive disorder, recurrent, unspecified: Secondary | ICD-10-CM | POA: Diagnosis not present

## 2018-05-17 DIAGNOSIS — R54 Age-related physical debility: Secondary | ICD-10-CM | POA: Diagnosis not present

## 2018-05-17 DIAGNOSIS — G3183 Dementia with Lewy bodies: Secondary | ICD-10-CM | POA: Diagnosis not present

## 2018-05-17 DIAGNOSIS — G20 Parkinson's disease: Secondary | ICD-10-CM | POA: Diagnosis not present

## 2018-05-17 DIAGNOSIS — F339 Major depressive disorder, recurrent, unspecified: Secondary | ICD-10-CM | POA: Diagnosis not present

## 2018-05-17 DIAGNOSIS — I679 Cerebrovascular disease, unspecified: Secondary | ICD-10-CM | POA: Diagnosis not present

## 2018-05-17 DIAGNOSIS — I10 Essential (primary) hypertension: Secondary | ICD-10-CM | POA: Diagnosis not present

## 2018-11-17 DEATH — deceased
